# Patient Record
Sex: Female | Born: 1940 | Race: White | Hispanic: No | Marital: Single | Smoking: Former smoker
Health system: Southern US, Community
[De-identification: ages and names within clinical notes are randomized; demographics above are authoritative.]

## PROBLEM LIST (undated history)

## (undated) DIAGNOSIS — C801 Malignant (primary) neoplasm, unspecified: Secondary | ICD-10-CM

## (undated) DIAGNOSIS — K259 Gastric ulcer, unspecified as acute or chronic, without hemorrhage or perforation: Secondary | ICD-10-CM

## (undated) DIAGNOSIS — F419 Anxiety disorder, unspecified: Secondary | ICD-10-CM

## (undated) DIAGNOSIS — E785 Hyperlipidemia, unspecified: Secondary | ICD-10-CM

## (undated) DIAGNOSIS — M810 Age-related osteoporosis without current pathological fracture: Secondary | ICD-10-CM

## (undated) DIAGNOSIS — Z8619 Personal history of other infectious and parasitic diseases: Secondary | ICD-10-CM

## (undated) DIAGNOSIS — G971 Other reaction to spinal and lumbar puncture: Secondary | ICD-10-CM

## (undated) DIAGNOSIS — M1990 Unspecified osteoarthritis, unspecified site: Secondary | ICD-10-CM

## (undated) DIAGNOSIS — K759 Inflammatory liver disease, unspecified: Secondary | ICD-10-CM

## (undated) DIAGNOSIS — M069 Rheumatoid arthritis, unspecified: Secondary | ICD-10-CM

## (undated) DIAGNOSIS — F32A Depression, unspecified: Secondary | ICD-10-CM

## (undated) DIAGNOSIS — I219 Acute myocardial infarction, unspecified: Secondary | ICD-10-CM

## (undated) DIAGNOSIS — J189 Pneumonia, unspecified organism: Secondary | ICD-10-CM

## (undated) DIAGNOSIS — K219 Gastro-esophageal reflux disease without esophagitis: Secondary | ICD-10-CM

## (undated) DIAGNOSIS — Z9289 Personal history of other medical treatment: Secondary | ICD-10-CM

## (undated) HISTORY — PX: COLONOSCOPY: SHX174

## (undated) HISTORY — PX: EPIDURAL BLOCK INJECTION: SHX1516

## (undated) HISTORY — PX: BACK SURGERY: SHX140

## (undated) HISTORY — PX: TMJ ARTHROPLASTY: SHX1066

## (undated) HISTORY — PX: ABDOMINAL HYSTERECTOMY: SHX81

## (undated) HISTORY — PX: ESOPHAGOGASTRODUODENOSCOPY: SHX1529

## (undated) HISTORY — PX: FRACTURE SURGERY: SHX138

## (undated) HISTORY — PX: OTHER SURGICAL HISTORY: SHX169

## (undated) HISTORY — PX: TONSILLECTOMY: SUR1361

---
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
## 1975-03-31 DIAGNOSIS — Z9289 Personal history of other medical treatment: Secondary | ICD-10-CM

## 1975-03-31 HISTORY — DX: Personal history of other medical treatment: Z92.89

## 1993-03-30 HISTORY — PX: BREAST SURGERY: SHX581

## 1998-03-30 HISTORY — PX: CARDIAC CATHETERIZATION: SHX172

## 2002-05-10 ENCOUNTER — Encounter
Admission: RE | Admit: 2002-05-10 | Discharge: 2002-05-10 | Payer: Self-pay | Admitting: Physical Medicine and Rehabilitation

## 2002-05-10 ENCOUNTER — Encounter: Payer: Self-pay | Admitting: Physical Medicine and Rehabilitation

## 2003-05-14 ENCOUNTER — Encounter: Admission: RE | Admit: 2003-05-14 | Discharge: 2003-05-14 | Payer: Self-pay | Admitting: Specialist

## 2003-07-06 ENCOUNTER — Encounter
Admission: RE | Admit: 2003-07-06 | Discharge: 2003-07-06 | Payer: Self-pay | Admitting: Physical Medicine and Rehabilitation

## 2005-05-29 ENCOUNTER — Encounter: Admission: RE | Admit: 2005-05-29 | Discharge: 2005-05-29 | Payer: Self-pay | Admitting: Specialist

## 2005-07-21 ENCOUNTER — Inpatient Hospital Stay (HOSPITAL_COMMUNITY): Admission: RE | Admit: 2005-07-21 | Discharge: 2005-07-24 | Payer: Self-pay | Admitting: Specialist

## 2007-12-26 ENCOUNTER — Ambulatory Visit (HOSPITAL_COMMUNITY): Admission: RE | Admit: 2007-12-26 | Discharge: 2007-12-26 | Payer: Self-pay | Admitting: Specialist

## 2010-08-12 NOTE — Op Note (Signed)
NAMEWHITNIE, Traci Garrison                ACCOUNT NO.:  [id number]   MEDICAL RECORD NO.:  [id number]          PATIENT TYPE:  AMB   LOCATION:  DAY                          FACILITY:  [facility]   PHYSICIAN:  Jene Every, M.D.    DATE OF BIRTH:  [birth year]   DATE OF PROCEDURE:  12/26/2007  DATE OF DISCHARGE:                               OPERATIVE REPORT   PREOPERATIVE DIAGNOSIS:  Impingement syndrome, possible rotator cuff  tear, right shoulder.   POSTOPERATIVE DIAGNOSIS:  Impingement syndrome, possible rotator cuff  tear, right shoulder.   PROCEDURE PERFORMED:  1. Examination under anesthesia.  2. Right shoulder arthroscopy.  3. Subacromial decompression, bursectomy.   ANESTHESIA:  General.   ASSISTANT:  Roma Schanz, P.A.   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  MRI indicating tendinitis, possible full-thickness tear.  She has been indicated for arthroscopic evaluation and debridement and  open repair if a tear was noted.  Risks and benefits discussed including  bleeding, infection, no change in symptoms, worsening symptoms, need for  open rotator cuff repair, etc.   TECHNIQUE:  The patient in supine beach-chair position after adequate  induction of general anesthesia, 1 gram of Kefzol, the right shoulder  and upper extremity was prepped and draped in the usual sterile fashion.  The patient had full range of motion under examination.  Surgical marker  was utilized to delineate the acromion and the [facility] joint.  Standard  posterolateral portal was made with the incision through the skin only.  A blunt cannula was then directed to the glenohumeral space in line with  the coracoid with the arm in the 70/30 traction position, penetrated  atraumatically.  Inspection of the joint revealed normal biceps tendon,  no evidence of labral tear, mild degenerative changes were  noted.  Subscap was normal. Evaluation of the rotator cuff revealed no evidence  of the tear of the rotator cuff.  Next we redirected the camera in the  subacromial space.  Exuberant hypertrophic bursitis and synovitis was  noted.  A lateral portal was fashioned through the skin, a cannula  introduced, a shaver was introduced and utilized to perform a bursectomy  and synovectomy.  We were then able to fully evaluate the rotator cuff  and there was good vascularity, no evidence of a tear with internal-  external rotation.  ArthroWand was introduced and utilized to resect the  CA ligament.  The anterolateral corner of the acromion was shaved with a  shaver following this and the subacromial space was debrided.  Full  inspection revealed no evidence of tear, good range.  All  instrumentation was removed.  Portals were closed with 4-0 nylon simple  sutures.  0.25% Marcaine with epinephrine was infiltrated in  the joint.  Wound was dressed sterilely.  Patient was awoken without  difficulty and transported to the recovery room in satisfactory  condition.   The patient tolerated the procedure well, no complications.      Jene Every, M.D.  Electronically Signed  JB/MEDQ  D:  12/26/2007  T:  12/27/2007  Job:  [id number]

## 2010-08-15 NOTE — Op Note (Signed)
Traci Garrison, Traci Garrison                ACCOUNT NO.:  [id number]   MEDICAL RECORD NO.:  [id number]           PATIENT TYPE:   LOCATION:                                 FACILITY:   PHYSICIAN:  Jene Every, M.D.         DATE OF BIRTH:   DATE OF PROCEDURE:  07/21/2005  DATE OF DISCHARGE:                                 OPERATIVE REPORT   PREOPERATIVE DIAGNOSES:  1.  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  3.  Status post lumbar fusion L4 to the sacrum.   PROCEDURES PERFORMED:  1.  Exploration of fusion L4-5, L5-S1.  2.  Removal of hardware from previous fusion, the left and the right lumbar      rod.  3.  Redo decompression at 4-5, decompression at 3-4.  4.  Pedicle screw instrumentation L3-4.  5.  TLIF 3-4 utilizing autologous and autograft bone.  6.  Lateral mass fusion utilizing autologous and autograft bone.  7.  Intraoperative free-running EMGs, neural monitoring for five hours.   ANESTHESIA:  General anesthesia.   ASSISTANT:  Kerrin Champagne, M.D.   BRIEF HISTORY AND INDICATIONS:  A 70 year old status post fusion of L4 to  the sacrum that had fused solidly, though the patient had persistent back  and left lower extremity radicular pain.  Further imaging indicated stenosis  at 3-4.  She had claudicating symptoms particularly on the left.  She had a  bone scan indicating no significant activity suggests a nonunion.  Her CT  scan demonstrated that as well.  She had discography indicating discordant  pain at 2-3 and 3-4.  Again, primarily her problem was at 3-4, the stenosis  and retrolisthesis with slight motion noted on radiographs.  Fusion was also  extended to not quite the neutral vertebra.  She was indicated for extension  of the fusion at 3-4, exploration of her fusion and decompression.  Risks  and benefits discussed including bleeding, infection, __________, CSF  leakage, epidural fibrosis, __________ disease, need for adjacent fusion in  the future,  etc., anesthetic complications, DVT and PE.   DESCRIPTION OF PROCEDURE:  Patient in supine position, after the induction  of general anesthesia and 1 g Kefzol, she was placed prone on the Wilson  frame, [place] table.  All bony prominences well padded.  Lumbar region  prepped and draped in the usual sterile fashion.  Incision was made from the  spinous process to the 3 to 5.  Subcutaneous tissue was dissected.  Electrocautery was utilized to  achieve hemostasis.  Dorsal lumbar fascia  identified and divided in line with skin incision.  Paraspinous muscle  elevated from lamina of 3 and to expose the pedicle screws and rods at 4-5  and S1.  Viper retractor was placed.  Electrocautery was utilized to achieve  hemostasis.  We utilized the Cell Saver as well.  After exposing the rods,  we explored the fusion at 4-5 and at 5-1.  There appeared to be a solid  fusion at both of those levels.  We then loosened the  connector rods.  This  was a TSRH 3-D system.  We left the pedicle screws intact as they seemed to  be sufficient.  Following this, we proceeded with exposure of the TPs at 3  and at 4.  This was performed.  Then removed the spinous process of 3,  morcellized and saved it for bone graft.  I then performed a laminotomy  centrally at 3-4 utilizing a 2 and 3 mm Kerrison.  Significant stenosis was  noted here at 3-4 and at 4-5 interval just along the lateral recess this  infolded hypertrophic ligamentum flavum and epidural fibrosis.  This was  decompressed and removed.  There was significant compression of the 4 root  in the lateral recess.  This was decompressed.  Carried this down below the  pedicle of 4.  Foraminotomy of 4 was performed removing epidural fibrosis  and the contiguous lamina.  Following this, we adequately decompressed the  sac of the 4 root and 3 root as well.  Electrocautery was utilized to  achieve hemostasis.  We had removed the inferior process of 3 and the  superior  articulating process of 4, exposing the foramen for the exposure to  the TLIF.  Bipolar electrocautery was utilized to achieve hemostasis of an  epidural venous plexus.  With the neural elements well protected, entered  the disc space with an Epstein.  This was enlarged with an osteotome  removing a posterior lip.  Next, this was dilated with an 8 mm dilator, felt  10 was too large.  We then used a laminar spreader between the pedicle screw  and the lamina, dilated that and then we cleaned the disc space with  curettes, high curettes, ring curettes, straight and up-biting pituitaries.  Following this, it was copiously irrigated.  This was best trialed to a 22  mm x 8 mm Medtronic PEEK cage.  This was packed with autologous cancellous  bone from the spinous process.  Prior to this, we packed the disc space with  cancellous bone from the decompression and then we inserted the TLIF,  removed the distraction with excellent placement and excellent purchase.  This was in the AP and lateral plane, found to be satisfactory.  We examined  it posteriorly. Placed a hockey stick probe in the foramen of 3 and 4, found  to be widely patent.   It was also widely patent at 3 and 4 on the left and down the lateral  recessed to 5.   Next, at the intersection of the transverse process, the outer border of the  facet and the pars midway on the TP we provided a starting hole with an awl,  first on the left and then on the right.  We observed this in the AP and  lateral plane under x-ray.  This was then advanced with a pedicle finder,  the small pedicle finder, without difficulty into the vertebral body, again  confirmed with x-ray.  It measured 40 mm in length, measured with a ball-tip  probe and was in cancellous bone at all times.  This was tapped with a 4.5  tap and then 5.5 3-D screws were then inserted.  Prior to this, we used a high speech bur to decorticate on the TP of 3 and the previous fusion mass   down at 4.  We also used autologous bone and we used bone graft.  We used a  Grafton bone graft and cancellous chips into the lateral recess.  In a  similar fashion, the pedicle screw was inserted on the right.  We used a  hockey stick probe and felt the inner surface of the pedicle and this was  found to be satisfactory without a broach in the cortex noted.  There were  no changes on the running EMGs as well.  We tested pedicle screws, left was  30 and the right was 17.  Next, we took a 90 mm rod that was precontoured  and further contoured it in the fashion of the placement of the four screws.  We placed off sets on the screws over the screw heads.  We lowered these  down on the four pedicle screws.  These were then tightened provisionally on  the left and on the right.  We compressed on the left and then tightened the  screws and then on the frt as well.  We then broke off the attachment after  appropriate torquing.  Then I checked the foramen at 3 and 4 and found to be  widely patent with a hockey stick probe.  In the AP and lateral plane this  was found to be satisfactory.  We decided not to use a crosslink because we  were just extending one level.  Wound copiously irrigated with antibiotic  irrigation.  Inspection revealed no evidence of CSF leakage or active  bleeding.  Inspected the canal, no evidence of loose bone.  Thrombin soaked  Gelfoam was placed over laminotomy defect. After the rod was placed, we put  some more of the cancellous chips Grafton into the lateral mass.  Next, we  placed a Hemovac and brought it out through a lateral stab wound in the  skin.  Copious irrigation with antibiotic irrigation.  Repair of the deep  muscle and fascia with #1 Vicryl interrupted figure-of-eight sutures.  Subcutaneous tissue reapproximated with 2-0 Vicryl sutures.  Skin  reapproximated with staples.  Wounds dressed sterilely.  Placed supine on  hospital bed, extubated without difficulty  and transported to the recovery  room in satisfactory condition.   Patient tolerated the procedure well with no complications.   BLOOD LOSS:  700 mL.  We returned 300 via Cell Saver.      Jene Every, M.D.  Electronically Signed     JB/MEDQ  D:  07/21/2005  T:  07/22/2005  Job:  [id number]

## 2010-08-15 NOTE — Discharge Summary (Signed)
Traci Garrison, Garrison                ACCOUNT NO.:  [id number]   MEDICAL RECORD NO.:  [id number]           PATIENT TYPE:   LOCATION:                                 FACILITY:   PHYSICIAN:  Jene Every, M.D.    DATE OF BIRTH:  [birth year]   DATE OF ADMISSION:  DATE OF DISCHARGE:                               DISCHARGE SUMMARY   ADMISSION DIAGNOSES:  1. Moderate spinal stenosis.  2. Degenerative disk disease, L3-4.  3. Osteoarthritis.  4. Depression.  5. Anxiety.  6. Remote history of hepatitis.   DISCHARGE DIAGNOSES:  1. Moderate spinal stenosis.  2. Degenerative disk disease, L3-4.  3. Osteoarthritis.  4. Depression.  5. Anxiety.  6. Remote history of hepatitis.   PROCEDURE:  The patient was taken to the OR on July 21, 2005 to undergo  a lumbar decompression at L3-4 with redo decompression L4-5, removal of  previous fusion hardware, extension of fusion up to L3-4.  Surgeon Dr.  Jene Every, assistant Vira Browns, anesthesia general, complications  none.   CONSULTATIONS:  PT/OT, case management.   HISTORY:  Traci Garrison is a pleasant, 70 year old female with a history of  fusion at L4-5 to S1 that had fused solidly.  However, the patient  eventually developed persistent back and left lower extremity pain.  Imaging studies showed degenerative changes at L3-4, diskogram also  indicated discordant pain at L2-3 and L3-4.  Patient underwent  conservative treatment without significant relief of her symptoms.  It  was felt, at this point, she would require extension of her fusion.  The  risks and benefits were discussed with the patient, she did wish to  proceed.   LABORATORY DATA:  Preoperative labs showed a white cell count of 6.5,  hemoglobin 14.1, hematocrit 40.3, normal coagulation studies, normal  urinalysis.  Repeat labs throughout the hospital course included CBCs on  a daily basis, white cell count remained normal, hemoglobin did drop to  a level of 9.4, but remained  stable at the time of discharge at a level  of 9.7, hematocrit 28.3.  Preoperative chemistry showed sodium 140,  potassium 3.9, with normal BUN and creatinine functions.  These were  followed throughout the hospital course, all remained within normal  range.  Urinalysis, done postoperatively, was negative.  Chest x-ray,  taken preoperatively, showed no acute cardiac processes.   HOSPITAL COURSE:  The patient was admitted, taken to the OR, and  underwent the above-stated procedure without difficulty.  She was then  transferred from the [facility] unit to the orthopedic floor for continued  postoperative care.  Postoperatively, she was placed on PCA analgesics.  Pain control was the major issues.  One HemoVac drain was placed, this  was removed postoperative day #1.  The patient did fairly well.  Her  pain control remained a big issue.  She was encouraged to mobilized  early.  PT/OT was consulted.  She did have a slight fever and incentive  spirometer was encouraged.  Patient did progress fairly well with  physical therapy.  She was voiding without difficulty.  Pain was managed  on p.o. analgesics.  It was felt that the patient was ready to be  discharged to home with home-health therapy needs met.   DISPOSITION:  The patient discharged home with home-health PT/OT.   DISCHARGE MEDICATIONS:  1. All home medication as well as her Duragesic patch.  2. Percocet 10/650 1 to 2 p.o. q.4 to 6 p.r.n. pain.  3. Robaxin 750 mg 1 p.o. t.i.d. p.r.n. spasm.   DIET:  As tolerated.   ACTIVITY:  She is to walk as tolerated utilizing brace and back  precautions.   WOUND CARE:  Change her dressing daily, it is okay for her to shower.   CONDITION ON DISCHARGE:  Stable.   FINAL DIAGNOSIS:  Doing well status post lumbar decompression with  lateral fusion utilizing pedicle screws and instrumentation.      Roma Schanz, P.A.      Jene Every, M.D.  Electronically Signed    CS/MEDQ  D:   01/12/2006  T:  01/12/2006  Job:  [id number]

## 2010-08-15 NOTE — H&P (Signed)
NAMEBRANDEN, Garrison                ACCOUNT NO.:  [id number]   MEDICAL RECORD NO.:  [id number]          PATIENT TYPE:  INP   LOCATION:  NA                           FACILITY:  [facility]   PHYSICIAN:  Jene Every, M.D.    DATE OF BIRTH:  25-May-1940   DATE OF ADMISSION:  DATE OF DISCHARGE:                                HISTORY & PHYSICAL   CHIEF COMPLAINT:  Back and bilateral lower extremity pain.   HISTORY:  [name] is a 70 year old female who has a longstanding history  of low back pain.  She initially was being seen in our practice by Dr. Ethelene Hal  for postoperative pain syndrome.  She has had a previous two-level lumbar  fusion done by Dr. Nadene Rubins approximately four years ago as she had  complications with a migrating screw that had to be removed.  At that time  she was having predominantly back pain.  Over the past six months, she  started developing bilateral lower extremity pain.  She had a discography  which was inconclusive.  She was evaluated by Dr. 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  Patient was then referred back to Dr. Ethelene Hal for consideration of  epidural steroid injections.  She underwent these without significant relief  of her symptoms. It was felt at this point, the patient would need an  extension of her fusion.  The risks and benefits of this were discussed with  the patient by Dr. Shelle Iron.  Case was also discussed with Dr. Otelia Sergeant as well.  Patient agreed with the risks and elected to proceed with the surgery.   CURRENT MEDICATIONS:  1.  Mobic one p.o. daily.  2.  Duragesic patch 100 mcg every two days.  3.  Percocet 10/650 one to two p.o. q.4-6h. p.r.n. pain.  4.  Robaxin 750 mg one p.o. t.i.d. p.r.n. spasm.  5.  Neurontin 600 mg one p.o. q.i.d.  6.  Premarin daily.  7.  Xanax 0.5 mg one and a half p.o. daily p.r.n.   ALLERGIES:  NO KNOWN DRUG ALLERGIES.   PAST MEDICAL HISTORY:  1.  Osteoarthritis.  2.  Depression.  3.  Anxiety.  4.  Remote history of hepatitis.   PAST SURGICAL HISTORY:  1.  Two-level spinal fusion L4 to S1.  2.  Oophorectomy.  3.  Bladder tack.  4.  Fractured left elbow.   SOCIAL HISTORY:  Patient is married.  She is disabled.  She is a previous  smoker for two to three years in college but had quit.  Negative for alcohol  consumption.  Husband will be caregiver following surgery.   FAMILY HISTORY:  Sister significant for MI, coronary artery disease age 38.  Mother and sister both have history of osteoarthritis.   REVIEW OF SYSTEMS:  GENERAL:  Patient denies any fevers, chills, night  sweats or bleeding tendencies.  CNS:  No blurred or double vision, seizure,  headache or paralysis.  RESPIRATORY:  No so, productive cough or hemoptysis.  CARDIOVASCULAR:  No chest pain, angina or orthopnea.  GU:  No dysuria,  hematuria or discharge.  GI: No nausea, vomiting or diarrhea, constipation  or bloody stools.  MUSCULOSKELETAL:  As pertinent in HPI.   PHYSICAL EXAMINATION:  GENERAL APPEARANCE:  This is a well-developed, well-  nourished female, sitting upright in mild distress.  VITAL SIGNS:  Pulse 80, respiratory 16, blood pressure 124/68.  HEENT:  Atraumatic and normocephalic.  Pupils are equal, round and reactive  to light.  Extraocular movements intact.  NECK:  Supple.  No lymphadenopathy.  CHEST:  Clear to auscultation bilaterally.  No wheezing, rhonchi or rales.  BREASTS/GU:  Not examined, not pertinent.  CARDIOVASCULAR:  Regular rate and rhythm without murmurs, rubs, or gallops.  ABDOMEN:  Soft and nontender, nondistended, bowel sounds x4.  SKIN:  No rashes or lesions are noted.  Patient does have positive straight  leg raising bilaterally, left greater than right, decreased sensation on the  left lower extremity.   IMPRESSION:  Adjacent segment disease, moderate spinal  stenosis,  degenerative changes L3-4.   PLAN:  Patient will be admitted to [facility] to  undergo decompression L3-4, TLIF with pedicle screw instrumentation, review  of hardware and exploration of previous fusion.      Roma Schanz, P.A.      Jene Every, M.D.  Electronically Signed    CS/MEDQ  D:  07/20/2005  T:  07/20/2005  Job:  [id number]

## 2010-09-28 DEATH — deceased

## 2013-11-02 ENCOUNTER — Other Ambulatory Visit: Payer: Self-pay | Admitting: Orthopedic Surgery

## 2013-11-02 DIAGNOSIS — M5137 Other intervertebral disc degeneration, lumbosacral region: Secondary | ICD-10-CM

## 2013-11-08 ENCOUNTER — Ambulatory Visit
Admission: RE | Admit: 2013-11-08 | Discharge: 2013-11-08 | Disposition: A | Discharge status: Home / Self Care | Payer: PRIVATE HEALTH INSURANCE | Source: Ambulatory Visit | Attending: Orthopedic Surgery | Admitting: Orthopedic Surgery

## 2013-11-08 DIAGNOSIS — M5137 Other intervertebral disc degeneration, lumbosacral region: Secondary | ICD-10-CM

## 2013-12-26 ENCOUNTER — Encounter (HOSPITAL_COMMUNITY): Payer: Self-pay | Admitting: Pharmacy Technician

## 2013-12-28 ENCOUNTER — Other Ambulatory Visit (HOSPITAL_COMMUNITY): Payer: Self-pay | Admitting: *Deleted

## 2013-12-28 NOTE — Pre-Procedure Instructions (Signed)
Cailan Antonucci  12/28/2013   Your procedure is scheduled on:  Thursday, January 04, 2014 at 7:30 AM.   Report to [facility] Entrance "A" Admitting Office at 5:30 AM.   Call this number if you have problems the morning of surgery: [phone]   Remember:   Do not eat food or drink liquids after midnight Wednesday,  01/03/14.   Take these medicines the morning of surgery with A SIP OF WATER: gabapentin (NEURONTIN), predniSONE (DELTASONE), tiZANidine (ZANAFLEX), oxyCODONE-acetaminophen (PERCOCET) - if needed, ALPRAZolam Duanne Moron) - if needed.  Bring brace with you day of surgery.    Do not wear jewelry, make-up or nail polish.  Do not wear lotions, powders, or perfumes. You may wear deodorant.  Do not shave 48 hours prior to surgery.   Do not bring valuables to the hospital.  [facility] is not responsible                  for any belongings or valuables.               Contacts, dentures or bridgework may not be worn into surgery.  Leave suitcase in the car. After surgery it may be brought to your room.  For patients admitted to the hospital, discharge time is determined by your                treatment team.               Special Instructions: [place] - Preparing for Surgery  Before surgery, you can play an important role.  Because skin is not sterile, your skin needs to be as free of germs as possible.  You can reduce the number of germs on you skin by washing with CHG (chlorahexidine gluconate) soap before surgery.  CHG is an antiseptic cleaner which kills germs and bonds with the skin to continue killing germs even after washing.  Please DO NOT use if you have an allergy to CHG or antibacterial soaps.  If your skin becomes reddened/irritated stop using the CHG and inform your nurse when you arrive at Short Stay.  Do not shave (including legs and underarms) for at least 48 hours prior to the first CHG shower.  You may shave your face.  Please follow these instructions  carefully:   1.  Shower with CHG Soap the night before surgery and the                                morning of Surgery.  2.  If you choose to wash your hair, wash your hair first as usual with your       normal shampoo.  3.  After you shampoo, rinse your hair and body thoroughly to remove the                      Shampoo.  4.  Use CHG as you would any other liquid soap.  You can apply chg directly       to the skin and wash gently with scrungie or a clean washcloth.  5.  Apply the CHG Soap to your body ONLY FROM THE NECK DOWN.        Do not use on open wounds or open sores.  Avoid contact with your eyes, ears, mouth and genitals (private parts).  Wash genitals (private parts) with your normal soap.  6.  Wash thoroughly,  paying special attention to the area where your surgery        will be performed.  7.  Thoroughly rinse your body with warm water from the neck down.  8.  DO NOT shower/wash with your normal soap after using and rinsing off       the CHG Soap.  9.  Pat yourself dry with a clean towel.            10.  Wear clean pajamas.            11.  Place clean sheets on your bed the night of your first shower and do not        sleep with pets.  Day of Surgery  Do not apply any lotions the morning of surgery.  Please wear clean clothes to the hospital/surgery center.     Please read over the following fact sheets that you were given: Pain Booklet, Coughing and Deep Breathing, Blood Transfusion Information, MRSA Information and Surgical Site Infection Prevention

## 2013-12-29 ENCOUNTER — Encounter (HOSPITAL_COMMUNITY): Payer: Self-pay

## 2013-12-29 ENCOUNTER — Ambulatory Visit (HOSPITAL_COMMUNITY)
Admission: RE | Admit: 2013-12-29 | Discharge: 2013-12-29 | Disposition: A | Discharge status: Home / Self Care | Payer: Medicare Other | Source: Ambulatory Visit | Attending: Surgery | Admitting: Surgery

## 2013-12-29 ENCOUNTER — Encounter (HOSPITAL_COMMUNITY)
Admission: RE | Admit: 2013-12-29 | Discharge: 2013-12-29 | Disposition: A | Discharge status: Home / Self Care | Payer: Medicare Other | Source: Ambulatory Visit | Attending: Orthopedic Surgery | Admitting: Orthopedic Surgery

## 2013-12-29 DIAGNOSIS — M5135 Other intervertebral disc degeneration, thoracolumbar region: Secondary | ICD-10-CM | POA: Insufficient documentation

## 2013-12-29 DIAGNOSIS — Z01818 Encounter for other preprocedural examination: Secondary | ICD-10-CM | POA: Insufficient documentation

## 2013-12-29 HISTORY — DX: Pneumonia, unspecified organism: J18.9

## 2013-12-29 HISTORY — DX: Unspecified osteoarthritis, unspecified site: M19.90

## 2013-12-29 HISTORY — DX: Inflammatory liver disease, unspecified: K75.9

## 2013-12-29 HISTORY — DX: Anxiety disorder, unspecified: F41.9

## 2013-12-29 LAB — CBC
HCT: 41 % (ref 36.0–46.0)
HEMOGLOBIN: 13.9 g/dL (ref 12.0–15.0)
MCH: 32.7 pg (ref 26.0–34.0)
MCHC: 33.9 g/dL (ref 30.0–36.0)
MCV: 96.5 fL (ref 78.0–100.0)
PLATELETS: 201 10*3/uL (ref 150–400)
RBC: 4.25 MIL/uL (ref 3.87–5.11)
RDW: 13 % (ref 11.5–15.5)
WBC: 3.9 10*3/uL — ABNORMAL LOW (ref 4.0–10.5)

## 2013-12-29 LAB — URINALYSIS, ROUTINE W REFLEX MICROSCOPIC
Bilirubin Urine: NEGATIVE
Glucose, UA: NEGATIVE mg/dL
Hgb urine dipstick: NEGATIVE
KETONES UR: NEGATIVE mg/dL
Leukocytes, UA: NEGATIVE
NITRITE: NEGATIVE
PH: 6 (ref 5.0–8.0)
Protein, ur: NEGATIVE mg/dL
Specific Gravity, Urine: 1.02 (ref 1.005–1.030)
Urobilinogen, UA: 0.2 mg/dL (ref 0.0–1.0)

## 2013-12-29 LAB — COMPREHENSIVE METABOLIC PANEL
ALK PHOS: 39 U/L (ref 39–117)
ALT: 20 U/L (ref 0–35)
AST: 24 U/L (ref 0–37)
Albumin: 4.5 g/dL (ref 3.5–5.2)
Anion gap: 14 (ref 5–15)
BUN: 18 mg/dL (ref 6–23)
CALCIUM: 8.7 mg/dL (ref 8.4–10.5)
CO2: 24 mEq/L (ref 19–32)
Chloride: 102 mEq/L (ref 96–112)
Creatinine, Ser: 0.73 mg/dL (ref 0.50–1.10)
GFR calc Af Amer: >90 mL/min (ref >90)
GFR calc non Af Amer: 85 mL/min — ABNORMAL LOW (ref >90)
GLUCOSE: 82 mg/dL (ref 70–99)
POTASSIUM: 3.7 meq/L (ref 3.7–5.3)
SODIUM: 140 meq/L (ref 137–147)
TOTAL PROTEIN: 7 g/dL (ref 6.0–8.3)
Total Bilirubin: 0.5 mg/dL (ref 0.3–1.2)

## 2013-12-29 LAB — SURGICAL PCR SCREEN
MRSA, PCR: NEGATIVE
Staphylococcus aureus: NEGATIVE

## 2013-12-29 LAB — TYPE AND SCREEN
ABO/RH(D): A POS
Antibody Screen: NEGATIVE

## 2013-12-29 LAB — PROTIME-INR
INR: 1.06 (ref 0.00–1.49)
Prothrombin Time: 13.8 seconds (ref 11.6–15.2)

## 2013-12-29 LAB — APTT: APTT: 30 s (ref 24–37)

## 2014-01-03 MED ORDER — CEFAZOLIN SODIUM-DEXTROSE 2-3 GM-% IV SOLR
2.0000 g | INTRAVENOUS | Status: AC
  Administered 2014-01-04: 2 g via INTRAVENOUS
  Filled 2014-01-03: qty 50

## 2014-01-03 NOTE — Anesthesia Preprocedure Evaluation (Addendum)
Anesthesia Evaluation  Patient identified by MRN, date of birth, ID band Patient awake    Reviewed: Allergy & Precautions, H&P , NPO status , Patient's Chart, lab work & pertinent test results  History of Anesthesia Complications Negative for: history of anesthetic complications  Airway Mallampati: II TM Distance: >3 FB Neck ROM: Full    Dental  (+) Teeth Intact, Dental Advisory Given   Pulmonary former smoker,  breath sounds clear to auscultation        Cardiovascular Rhythm:Regular Rate:Normal     Neuro/Psych Anxiety  Neuromuscular disease    GI/Hepatic   Endo/Other  negative endocrine ROS  Renal/GU negative Renal ROS     Musculoskeletal  (+) Arthritis -, Rheumatoid disorders,    Abdominal (+)  Abdomen: soft.    Peds  Hematology   Anesthesia Other Findings   Reproductive/Obstetrics negative OB ROS                         Anesthesia Physical Anesthesia Plan  ASA: II  Anesthesia Plan: General   Post-op Pain Management:    Induction: Intravenous  Airway Management Planned: Oral ETT  Additional Equipment:   Intra-op Plan:   Post-operative Plan:   Informed Consent: I have reviewed the patients History and Physical, chart, labs and discussed the procedure including the risks, benefits and alternatives for the proposed anesthesia with the patient or authorized representative who has indicated his/her understanding and acceptance.     Plan Discussed with:   Anesthesia Plan Comments: (2nd IV after turning, verify T&S, watch for pressure on face and eyes while prone)        Anesthesia Quick Evaluation

## 2014-01-04 ENCOUNTER — Encounter (HOSPITAL_COMMUNITY)
Admission: RE | Disposition: A | Discharge status: Home / Self Care | Payer: Self-pay | Source: Ambulatory Visit | Attending: Orthopedic Surgery

## 2014-01-04 ENCOUNTER — Inpatient Hospital Stay (HOSPITAL_COMMUNITY): Payer: Medicare Other

## 2014-01-04 ENCOUNTER — Inpatient Hospital Stay (HOSPITAL_COMMUNITY)
Admission: RE | Admit: 2014-01-04 | Discharge: 2014-01-05 | DRG: 552 | Disposition: A | Discharge status: Home / Self Care | Payer: Medicare Other | Source: Ambulatory Visit | Attending: Orthopedic Surgery | Admitting: Orthopedic Surgery

## 2014-01-04 ENCOUNTER — Encounter (HOSPITAL_COMMUNITY): Payer: Self-pay | Admitting: *Deleted

## 2014-01-04 ENCOUNTER — Inpatient Hospital Stay (HOSPITAL_COMMUNITY): Payer: Medicare Other | Admitting: Certified Registered"

## 2014-01-04 ENCOUNTER — Encounter (HOSPITAL_COMMUNITY): Payer: Medicare Other | Admitting: Certified Registered"

## 2014-01-04 DIAGNOSIS — Z87891 Personal history of nicotine dependence: Secondary | ICD-10-CM | POA: Diagnosis not present

## 2014-01-04 DIAGNOSIS — M549 Dorsalgia, unspecified: Secondary | ICD-10-CM | POA: Diagnosis present

## 2014-01-04 DIAGNOSIS — G8929 Other chronic pain: Secondary | ICD-10-CM | POA: Diagnosis present

## 2014-01-04 DIAGNOSIS — T148XXA Other injury of unspecified body region, initial encounter: Secondary | ICD-10-CM

## 2014-01-04 DIAGNOSIS — S20219A Contusion of unspecified front wall of thorax, initial encounter: Secondary | ICD-10-CM | POA: Diagnosis present

## 2014-01-04 DIAGNOSIS — M961 Postlaminectomy syndrome, not elsewhere classified: Secondary | ICD-10-CM | POA: Diagnosis present

## 2014-01-04 DIAGNOSIS — T888XXS Other specified complications of surgical and medical care, not elsewhere classified, sequela: Secondary | ICD-10-CM

## 2014-01-04 DIAGNOSIS — M069 Rheumatoid arthritis, unspecified: Secondary | ICD-10-CM | POA: Diagnosis present

## 2014-01-04 DIAGNOSIS — B1910 Unspecified viral hepatitis B without hepatic coma: Secondary | ICD-10-CM | POA: Diagnosis present

## 2014-01-04 DIAGNOSIS — S279XXA Injury of unspecified intrathoracic organ, initial encounter: Secondary | ICD-10-CM

## 2014-01-04 DIAGNOSIS — Z981 Arthrodesis status: Secondary | ICD-10-CM

## 2014-01-04 DIAGNOSIS — J939 Pneumothorax, unspecified: Secondary | ICD-10-CM | POA: Diagnosis present

## 2014-01-04 DIAGNOSIS — M4806 Spinal stenosis, lumbar region: Secondary | ICD-10-CM | POA: Diagnosis present

## 2014-01-04 DIAGNOSIS — F419 Anxiety disorder, unspecified: Secondary | ICD-10-CM | POA: Diagnosis present

## 2014-01-04 DIAGNOSIS — M419 Scoliosis, unspecified: Secondary | ICD-10-CM | POA: Diagnosis present

## 2014-01-04 DIAGNOSIS — Z79899 Other long term (current) drug therapy: Secondary | ICD-10-CM

## 2014-01-04 DIAGNOSIS — J95811 Postprocedural pneumothorax: Secondary | ICD-10-CM

## 2014-01-04 DIAGNOSIS — M48061 Spinal stenosis, lumbar region without neurogenic claudication: Secondary | ICD-10-CM

## 2014-01-04 SURGERY — CANCELLED PROCEDURE
Anesthesia: General

## 2014-01-04 MED ORDER — PROPOFOL 10 MG/ML IV BOLUS
INTRAVENOUS | Status: AC
  Filled 2014-01-04: qty 20

## 2014-01-04 MED ORDER — PNEUMOCOCCAL VAC POLYVALENT 25 MCG/0.5ML IJ INJ
0.5000 mL | INJECTION | INTRAMUSCULAR | Status: DC
  Filled 2014-01-04: qty 0.5

## 2014-01-04 MED ORDER — IOHEXOL 300 MG/ML  SOLN
80.0000 mL | Freq: Once | INTRAMUSCULAR | Status: AC | PRN
  Administered 2014-01-04: 80 mL via INTRAVENOUS

## 2014-01-04 MED ORDER — MIDAZOLAM HCL 5 MG/5ML IJ SOLN
INTRAMUSCULAR | Status: DC | PRN
  Administered 2014-01-04 (×2): 1 mg via INTRAVENOUS

## 2014-01-04 MED ORDER — POTASSIUM CHLORIDE IN NACL 20-0.45 MEQ/L-% IV SOLN
INTRAVENOUS | Status: DC
  Administered 2014-01-04 – 2014-01-05 (×2): via INTRAVENOUS
  Filled 2014-01-04 (×4): qty 1000

## 2014-01-04 MED ORDER — PROPOFOL 10 MG/ML IV BOLUS
INTRAVENOUS | Status: DC | PRN
  Administered 2014-01-04: 150 mg via INTRAVENOUS

## 2014-01-04 MED ORDER — HYDROCORTISONE NA SUCCINATE PF 100 MG IJ SOLR
INTRAMUSCULAR | Status: DC | PRN
  Administered 2014-01-04: 100 mg via INTRAVENOUS

## 2014-01-04 MED ORDER — ONDANSETRON HCL 4 MG/2ML IJ SOLN: 4.0000 mg | Freq: Four times a day (QID) | INTRAMUSCULAR | Status: DC | PRN

## 2014-01-04 MED ORDER — ZOLPIDEM TARTRATE 5 MG PO TABS
5.0000 mg | ORAL_TABLET | Freq: Every evening | ORAL | Status: DC | PRN
  Administered 2014-01-04: 5 mg via ORAL
  Filled 2014-01-04: qty 1

## 2014-01-04 MED ORDER — GABAPENTIN 300 MG PO CAPS: 600.0000 mg | ORAL_CAPSULE | Freq: Three times a day (TID) | ORAL | Status: DC

## 2014-01-04 MED ORDER — DEXAMETHASONE SODIUM PHOSPHATE 4 MG/ML IJ SOLN
INTRAMUSCULAR | Status: AC
  Filled 2014-01-04: qty 1

## 2014-01-04 MED ORDER — TIZANIDINE HCL 4 MG PO TABS
4.0000 mg | ORAL_TABLET | Freq: Three times a day (TID) | ORAL | Status: DC
  Administered 2014-01-04 – 2014-01-05 (×3): 4 mg via ORAL
  Filled 2014-01-04 (×5): qty 1

## 2014-01-04 MED ORDER — FOLIC ACID 800 MCG PO TABS: 400.0000 ug | ORAL_TABLET | Freq: Every day | ORAL | Status: DC

## 2014-01-04 MED ORDER — BUPIVACAINE-EPINEPHRINE (PF) 0.25% -1:200000 IJ SOLN
INTRAMUSCULAR | Status: AC
  Filled 2014-01-04: qty 30

## 2014-01-04 MED ORDER — METOCLOPRAMIDE HCL 5 MG/ML IJ SOLN: 5.0000 mg | Freq: Three times a day (TID) | INTRAMUSCULAR | Status: DC | PRN

## 2014-01-04 MED ORDER — PREDNISONE 5 MG PO TABS
5.0000 mg | ORAL_TABLET | Freq: Every day | ORAL | Status: DC
  Administered 2014-01-05: 5 mg via ORAL
  Filled 2014-01-04 (×2): qty 1

## 2014-01-04 MED ORDER — LIDOCAINE HCL (CARDIAC) 20 MG/ML IV SOLN
INTRAVENOUS | Status: AC
  Filled 2014-01-04: qty 10

## 2014-01-04 MED ORDER — FENTANYL CITRATE 0.05 MG/ML IJ SOLN
INTRAMUSCULAR | Status: AC
  Filled 2014-01-04: qty 5

## 2014-01-04 MED ORDER — SUCCINYLCHOLINE CHLORIDE 20 MG/ML IJ SOLN
INTRAMUSCULAR | Status: DC | PRN
  Administered 2014-01-04: 100 mg via INTRAVENOUS

## 2014-01-04 MED ORDER — ACETAMINOPHEN 10 MG/ML IV SOLN: 1000.0000 mg | INTRAVENOUS | Status: DC

## 2014-01-04 MED ORDER — METHOTREXATE 2.5 MG PO TABS: 15.0000 mg | ORAL_TABLET | ORAL | Status: DC

## 2014-01-04 MED ORDER — GABAPENTIN 600 MG PO TABS: 300.0000 mg | ORAL_TABLET | Freq: Three times a day (TID) | ORAL | Status: DC

## 2014-01-04 MED ORDER — OXYCODONE HCL 5 MG PO TABS
ORAL_TABLET | ORAL | Status: AC
  Administered 2014-01-04: 10 mg via ORAL
  Filled 2014-01-04: qty 2

## 2014-01-04 MED ORDER — GABAPENTIN 600 MG PO TABS
600.0000 mg | ORAL_TABLET | Freq: Three times a day (TID) | ORAL | Status: DC
  Administered 2014-01-04: 600 mg via ORAL
  Filled 2014-01-04: qty 1

## 2014-01-04 MED ORDER — THROMBIN 20000 UNITS EX SOLR
CUTANEOUS | Status: AC
  Filled 2014-01-04: qty 20000

## 2014-01-04 MED ORDER — LACTATED RINGERS IV SOLN
INTRAVENOUS | Status: DC | PRN
  Administered 2014-01-04: 07:00:00 via INTRAVENOUS

## 2014-01-04 MED ORDER — OXYCODONE HCL 5 MG PO TABS
5.0000 mg | ORAL_TABLET | Freq: Four times a day (QID) | ORAL | Status: DC | PRN
  Administered 2014-01-04 – 2014-01-05 (×4): 10 mg via ORAL
  Filled 2014-01-04 (×3): qty 2

## 2014-01-04 MED ORDER — FENTANYL CITRATE 0.05 MG/ML IJ SOLN
INTRAMUSCULAR | Status: DC | PRN
  Administered 2014-01-04: 50 ug via INTRAVENOUS
  Administered 2014-01-04: 100 ug via INTRAVENOUS

## 2014-01-04 MED ORDER — MIDAZOLAM HCL 2 MG/2ML IJ SOLN
INTRAMUSCULAR | Status: AC
Start: 2014-01-04 — End: 2014-01-04
  Filled 2014-01-04: qty 2

## 2014-01-04 MED ORDER — DOCUSATE SODIUM 100 MG PO CAPS
100.0000 mg | ORAL_CAPSULE | Freq: Two times a day (BID) | ORAL | Status: DC
  Administered 2014-01-05: 100 mg via ORAL
  Filled 2014-01-04 (×2): qty 1

## 2014-01-04 MED ORDER — ONDANSETRON HCL 4 MG/2ML IJ SOLN
INTRAMUSCULAR | Status: DC | PRN
  Administered 2014-01-04: 4 mg via INTRAVENOUS

## 2014-01-04 MED ORDER — DEXAMETHASONE SODIUM PHOSPHATE 4 MG/ML IJ SOLN: 4.0000 mg | INTRAMUSCULAR | Status: DC

## 2014-01-04 MED ORDER — LIDOCAINE HCL (CARDIAC) 20 MG/ML IV SOLN
INTRAVENOUS | Status: DC | PRN
  Administered 2014-01-04: 100 mg via INTRAVENOUS

## 2014-01-04 MED ORDER — METOCLOPRAMIDE HCL 5 MG PO TABS: 5.0000 mg | ORAL_TABLET | Freq: Three times a day (TID) | ORAL | Status: DC | PRN

## 2014-01-04 MED ORDER — LACTATED RINGERS IV SOLN
INTRAVENOUS | Status: DC | PRN
  Administered 2014-01-04: 08:00:00 via INTRAVENOUS

## 2014-01-04 MED ORDER — GABAPENTIN 300 MG PO CAPS
300.0000 mg | ORAL_CAPSULE | Freq: Three times a day (TID) | ORAL | Status: DC
  Administered 2014-01-05: 300 mg via ORAL
  Filled 2014-01-04 (×3): qty 1

## 2014-01-04 MED ORDER — ONDANSETRON HCL 4 MG PO TABS: 4.0000 mg | ORAL_TABLET | Freq: Four times a day (QID) | ORAL | Status: DC | PRN

## 2014-01-04 MED ORDER — FOLIC ACID 0.5 MG HALF TAB
0.5000 mg | ORAL_TABLET | Freq: Every day | ORAL | Status: DC
  Administered 2014-01-05: 0.5 mg via ORAL
  Filled 2014-01-04: qty 1

## 2014-01-04 MED ORDER — ALPRAZOLAM 0.25 MG PO TABS
0.2500 mg | ORAL_TABLET | Freq: Two times a day (BID) | ORAL | Status: DC | PRN
  Administered 2014-01-04 – 2014-01-05 (×2): 0.5 mg via ORAL
  Filled 2014-01-04 (×3): qty 2

## 2014-01-04 SURGICAL SUPPLY — 56 items
BLADE SURG 10 STRL SS (BLADE) ×4 IMPLANT
BLADE SURG ROTATE 9660 (MISCELLANEOUS) IMPLANT
CLOSURE WOUND 1/2 X4 (GAUZE/BANDAGES/DRESSINGS)
CORDS BIPOLAR (ELECTRODE) ×3 IMPLANT
COVER SURGICAL LIGHT HANDLE (MISCELLANEOUS) ×4 IMPLANT
DRAPE C-ARM 42X72 X-RAY (DRAPES) ×4 IMPLANT
DRAPE INCISE IOBAN 66X45 STRL (DRAPES) ×4 IMPLANT
DRAPE ORTHO SPLIT 77X108 STRL (DRAPES) ×3
DRAPE POUCH INSTRU U-SHP 10X18 (DRAPES) ×4 IMPLANT
DRAPE SURG ORHT 6 SPLT 77X108 (DRAPES) ×2 IMPLANT
DRAPE U-SHAPE 47X51 STRL (DRAPES) ×8 IMPLANT
DRSG MEPILEX BORDER 4X8 (GAUZE/BANDAGES/DRESSINGS) ×4 IMPLANT
DURAPREP 26ML APPLICATOR (WOUND CARE) ×4 IMPLANT
ELECT BLADE 4.0 EZ CLEAN MEGAD (MISCELLANEOUS) ×3
ELECT PENCIL ROCKER SW 15FT (MISCELLANEOUS) ×4 IMPLANT
ELECT REM PT RETURN 9FT ADLT (ELECTROSURGICAL) ×3
ELECTRODE BLDE 4.0 EZ CLN MEGD (MISCELLANEOUS) ×2 IMPLANT
ELECTRODE REM PT RTRN 9FT ADLT (ELECTROSURGICAL) ×1 IMPLANT
GAUZE SPONGE 4X4 16PLY XRAY LF (GAUZE/BANDAGES/DRESSINGS) ×4 IMPLANT
GLOVE BIOGEL PI IND STRL 8 (GLOVE) ×2 IMPLANT
GLOVE BIOGEL PI IND STRL 8.5 (GLOVE) ×2 IMPLANT
GLOVE BIOGEL PI INDICATOR 8 (GLOVE) ×2
GLOVE BIOGEL PI INDICATOR 8.5 (GLOVE) ×2
GLOVE ECLIPSE 8.5 STRL (GLOVE) ×4 IMPLANT
GLOVE ORTHO TXT STRL SZ7.5 (GLOVE) ×4 IMPLANT
GOWN STRL REUS W/ TWL XL LVL3 (GOWN DISPOSABLE) ×2 IMPLANT
GOWN STRL REUS W/TWL 2XL LVL3 (GOWN DISPOSABLE) ×8 IMPLANT
GOWN STRL REUS W/TWL XL LVL3 (GOWN DISPOSABLE) ×6
KIT BASIN OR (CUSTOM PROCEDURE TRAY) ×4 IMPLANT
KIT ROOM TURNOVER OR (KITS) ×4 IMPLANT
NEEDLE 22X1 1/2 (OR ONLY) (NEEDLE) ×4 IMPLANT
NEEDLE SPNL 18GX3.5 QUINCKE PK (NEEDLE) ×3 IMPLANT
NS IRRIG 1000ML POUR BTL (IV SOLUTION) ×4 IMPLANT
PACK LAMINECTOMY ORTHO (CUSTOM PROCEDURE TRAY) ×4 IMPLANT
PACK UNIVERSAL I (CUSTOM PROCEDURE TRAY) ×3 IMPLANT
PAD ARMBOARD 7.5X6 YLW CONV (MISCELLANEOUS) ×6 IMPLANT
SPONGE LAP 4X18 X RAY DECT (DISPOSABLE) ×4 IMPLANT
SPONGE SURGIFOAM ABS GEL 100 (HEMOSTASIS) ×3 IMPLANT
STAPLER VISISTAT 35W (STAPLE) ×4 IMPLANT
STRIP CLOSURE SKIN 1/2X4 (GAUZE/BANDAGES/DRESSINGS) IMPLANT
SURGIFLO TRUKIT (HEMOSTASIS) IMPLANT
SUT BONE WAX W31G (SUTURE) ×4 IMPLANT
SUT MON AB 3-0 SH 27 (SUTURE) ×6
SUT MON AB 3-0 SH27 (SUTURE) ×4 IMPLANT
SUT VIC AB 1 CT1 27 (SUTURE) ×6
SUT VIC AB 1 CT1 27XBRD ANBCTR (SUTURE) ×4 IMPLANT
SUT VIC AB 1 CTX 36 (SUTURE) ×6
SUT VIC AB 1 CTX36XBRD ANBCTR (SUTURE) ×2 IMPLANT
SUT VIC AB 2-0 CT1 18 (SUTURE) ×8 IMPLANT
SYR BULB IRRIGATION 50ML (SYRINGE) ×4 IMPLANT
SYR CONTROL 10ML LL (SYRINGE) ×4 IMPLANT
TAPE CLOTH 4X10 WHT NS (GAUZE/BANDAGES/DRESSINGS) ×6 IMPLANT
TOWEL OR 17X24 6PK STRL BLUE (TOWEL DISPOSABLE) ×4 IMPLANT
TOWEL OR 17X26 10 PK STRL BLUE (TOWEL DISPOSABLE) ×4 IMPLANT
TRAY FOLEY CATH 16FR SILVER (SET/KITS/TRAYS/PACK) ×3 IMPLANT
WATER STERILE IRR 1000ML POUR (IV SOLUTION) ×4 IMPLANT

## 2014-01-04 NOTE — Progress Notes (Signed)
Utilization review completed.  

## 2014-01-04 NOTE — Progress Notes (Signed)
CT scan of chest done.  Appears to be a venous bleed, not increased from OR CXR, small pneumo seen.  CT surgery consulted to evaluate.  Have discussed with surgeon.

## 2014-01-04 NOTE — H&P (Signed)
History of Present Illness The patient is a 73 year old female who presents with back pain. The patient is here today in referral from Dr Tonita Cong. The patient reports low back symptoms including pain (left leg down to the foot) and low back pain which began year(s) ago without any known injury. and Symptoms include pain, while symptoms do not include numbness, incontinence of stool or incontinence of urine. The patient describes the severity of their symptoms as moderate in severity. The patient feels as if the symptoms are worsening. Symptoms are exacerbated by bending. Current treatment includes opioid analgesics (Percocet from Dr Nelva Bush (pain management)) and muscle relaxants. Prior to being seen today the patient was previously evaluated in this clinic (Dr Tonita Cong and Dr Nelva Bush). Past evaluation has included x-ray of the lumbar spine and MRI of the lumbar spine (@ [facility]). Past treatment has included epidural injections and back surgery. Note for "Back pain": The patient is here to discuss other options.   Subjective Transcription  She is a very pleasant, young woman, who has been having progressive, debilitating pain for several years now. In 2007, she had a 3-4 TLIF. Prior to that she had a 4-5/5-1 decompression and fusion that was complicated by hardware malposition that required re-exploration and repositioning. She presents today because of ongoing, severe, debilitating back, buttock and bilateral leg pain, left side worse than the right. To date she has had various forms of injection therapy, P.T., activity modification, narcotic and non-narcotic medications, and nothing has provided her any significant relief. Her quality of life continues to suffer.  A recent MRI done 08-07-13 shows she does have transitional anatomy, but we are assuming she had the spinal fusion at 3-4, 4-5 and 5-1. At 2-3 there is an adjacent collapse, and at 1-2 there is moderate degenerative adjacent collapse. There is some  stenosis, but it is mostly kyphotic adjacent deformity. 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 She also has an adjacent scoliosis above her fusion of about 15 degrees.   Allergies  No Known Allergies08/16/2012 No Known Drug Allergies10/25/2012  Family History Heart Disease sister First Degree Relatives Diabetes Mellitus grandfather mothers side Heart disease in female family member before age 34 Rheumatoid Arthritis mother, sister, grandmother mothers side and grandfather mothers side  Social History  Number of flights of stairs before winded 1 Marital status married Living situation live with spouse Tobacco use Former smoker. in college, former smoker; smoke(d) 1 pack(s) per day Tobacco / smoke exposure no Pain Contract no Illicit drug use yes Current work status disabled Children 1 Alcohol use Occasional alcohol use. current drinker; drinks hard liquor; only occasionally per week Exercise Exercises weekly; does running / walking Drug/Alcohol Rehab (Previously) no Drug/Alcohol Rehab (Currently) no  Medication History  Neurontin (300MG  Capsule, 1 Oral three times daily, Taken starting 10/05/2013) Active. Duragesic-75 (75MCG/HR Patch 72HR, 1 Transdermal 2 days, Taken starting 10/05/2013) Active. TiZANidine HCl (4MG  Tablet, 1 (one) Oral three times daily, as needed, Taken starting 10/05/2013) Active. Percocet (10-325MG  Tablet, 1 (one) Oral four times daily, as needed, Taken starting 10/05/2013) Active. Actemra (Intravenous) Specific dose unknown - Active. Estradiol (0.5MG  Tablet, Oral) Active. PredniSONE (5MG  Tablet, Oral) Active. ALPRAZolam (0.5MG  Tablet, 1-3 Oral daily) Active. Ambien (10MG  Tablet, 1/2 Oral at bedtime) Active. Vivelle-Dot (0.05MG /24HR Patch TW, 1 Transdermal two times a week) Active. Methotrexate (2.5MG  Tablet, 4 Oral once a week) Active. Vitamins/Minerals (1 Oral  daily) Active. Phentermine HCl (37.5MG  Tablet, Oral as needed)  Active. Medications Reconciled  Past Surgical History  Spinal Surgery Tonsillectomy Tubal Ligation Arthroscopy of Shoulder right Hysterectomy complete (non-cancerous) Spinal Fusion lower back  Other Problems Anxiety Disorder Chronic Pain Hepatitis B Rheumatoid Arthritis Ulcer disease  Objective Transcription  She's a pleasant woman, who appears younger than her stated age. She is alert and oriented times 3. She has back pain with palpation, horrific pain with the neutral position, unable to extend because of the pain. She has some discomfort with forward flexion. EHL, tibialis anterior, gastrocnemius are 5/5. No foot drop. Compartments are soft and nontender. No thigh pain, no hip pain, no knee pain, no ankle pain. She has symmetrical, but diminished, DTRs at the knee and Achilles, negative Babinski test, no clonus. No incontinence of bowel or bladder. Well-healed surgical scar in the midline of the lumbar spine.  Imaging Studies: reviewed standing xrays and CT scan.  Pre-op surgical computer planning performed as well.  Patient with predominate back pain secondary to alignment.  Will move forward with planned fusion and corrective osteotomy to improve sagittal alignment  Assessments Transcription At this point in time, the patient has a very complex deformity, both coronal and sagittal plane mal-alignment.    Plans Transcription  To address this surgically, she would require, at the very least, a 2-3 X-LIF to help address the sagittal imbalance and then most likely extending the fusion up to about T10 to address the coronal imbalance and to help improve her sagittal balance. I would also have to remove the hardware in the lumbar spine, place new pedicle screws and extend her fusion to about T10. We have had a long discussion about this, and she would like to be considered for this surgery. I am  somewhat concerned because of her age, but she is in good medical health. The risks include infection, bleeding, nerve damage, death, stroke, paralysis, blood clots, pneumothorax, need for further surgery, loss of bowel or bladder control, ongoing or worse pain, nonunion, hardware failure.

## 2014-01-04 NOTE — Progress Notes (Signed)
Day of Surgery Procedure(s) (LRB): CANCELLED PROCEDURE (N/A) Subjective Patient examined, chest x-ray and CT scan of chest reviewed, Echocardiogram performed in the PACU is reviewed  I was asked to evaluate this 73 year old Caucasian female for a mediastinal hematoma sustained after attempted central line placement by anesthesiologist this morning in preparation for spine constructed surgery. The central line attempt was made for the left subclavian vein. By report the initial needle stick was uneventful, the guidewire was passed without difficulty and the catheter was inserted but had no blood return. The catheter was removed. A second attempt at cannulation of the left subclavian vein was made however the wire would not advance. The patient then had a portable chest x-ray which showed some superior mediastinal widening.A followup CTA of the chest was performed showing the hematoma a without active extravasation from the great vessels. There is no evidence of vessel wall injury or intimal tear of the aortic or great vessels.   The patient is not on chronic blood thinners.She does have rheumatoid arthritis and is on chronic methotrexate.  When Iexamined the patient she iwasresting comfortably in the recovery room alert responsive and with stable vital signs.  Objective: Vital signs in last 24 hours: Temp:  [94.1 F (34.5 C)-97.1 F (36.2 C)] 97 F (36.1 C) (10/08 1027) Pulse Rate:  [74-86] 86 (10/08 1030) Cardiac Rhythm:  [-]  Resp:  [9-26] 11 (10/08 1030) BP: (148-167)/(62-77) 151/77 mmHg (10/08 1030) SpO2:  [96 %-100 %] 96 % (10/08 1030) Weight:  [171 lb (77.565 kg)] 171 lb (77.565 kg) (10/08 0552)  Hemodynamic parameters for last 24 hours:  Normal sinus rhythm, vital signs stable, oxygen saturation normal  Intake/Output from previous day:   Intake/Output this shift: Total I/O In: 1000 [I.V.:1000] Out: 9 [Urine:22]  Exam 73 year old female pleasant in the PACU bed in no  distress Neck without JVD mass or hematoma Small puncture wound left parasternal skin from attempted line placement-mild bruising but no active bleeding Breath sounds clear and equal Cardiac rhythm regular, soft 2/6 systolic flow murmur, No friction rub Abdomen-soft nontender Extremities-good pulses in both upper extremities, lower extremities warm and well perfused Neurologic-alert and appropriate, no focal motor deficit  Lab Results: No results found for this basename: WBC, HGB, HCT, PLT,  in the last 72 hours BMET: No results found for this basename: NA, K, CL, CO2, GLUCOSE, BUN, CREATININE, CALCIUM,  in the last 72 hours  PT/INR: No results found for this basename: LABPROT, INR,  in the last 72 hours ABG No results found for this basename: phart, pco2, po2, hco3, tco2, acidbasedef, o2sat   CBG (last 3)  No results found for this basename: GLUCAP,  in the last 72 hours  Assessment/Plan: S/P Procedure(s) (LRB): CANCELLED PROCEDURE (N/A)  Mediastinal hematoma from attempted central line placement through left subclavian vein-no active bleeding or compromise of the airway or other vascular structures. Trace left apical pneumothorax on CT scan. Echocardiogram showing good biventricular function without pericardial effusion.  The mediastinal hematoma should resolve without surgical intervention-avoid all anticoagulation including aspirin. The planned spine reconstructive operation should be rescheduled for later to allow 1-2 weeks for the mediastinal hematoma to resolve  The patient will be admitted for observation and a followup chest x-ray and CBC panned for this afternoon. Repeat studies in the a.m. will be reviewed and if stable the patient can be discharged.  LOS: 0 days    VAN TRIGT III,PETER 01/04/2014

## 2014-01-04 NOTE — Progress Notes (Signed)
Patient in room N610.  Had lunch, no complaints, no respiratory problems noted.   Appreciate consultants help.   Planning to go home  tomorrow following repeat evaluation.

## 2014-01-04 NOTE — Progress Notes (Signed)
TO CT with crna

## 2014-01-04 NOTE — Anesthesia Postprocedure Evaluation (Signed)
  Anesthesia Post-op Note  Patient: Traci Garrison  Procedure(s) Performed: Procedure(s): CANCELLED PROCEDURE (N/A)  Patient Location: PACU  Anesthesia Type:General  Level of Consciousness: awake  Airway and Oxygen Therapy: Patient Spontanous Breathing and Patient connected to nasal cannula oxygen  Post-op Pain: mild  Post-op Assessment: Post-op Vital signs reviewed and Respiratory Function Stable  Post-op Vital Signs: Reviewed and stable  Last Vitals:  Filed Vitals:   01/04/14 1000  BP: 167/76  Pulse: 79  Temp: 35.8 C  Resp: 9    Complications: cardiovascular complications  Left subclavian line attempt with evidence of venous bleed, CT of Chest done,  Does not appear to be enlarging.  CT surgery consulted.  Surgery cancelled.

## 2014-01-04 NOTE — Progress Notes (Signed)
Have discussed CT findings of small mediastinal hematoma,  no active bleeding seen, and  small pneumo with patient and spouse.  Patient awake and oriented in PACU following CT exam.  CT surgery has been consulted for recommendations regarding further management.

## 2014-01-04 NOTE — Progress Notes (Signed)
  Echocardiogram 2D Echocardiogram has been performed.  Traci Garrison FRANCES 01/04/2014, 11:16 AM

## 2014-01-04 NOTE — Progress Notes (Signed)
Back from CT with CRNA.

## 2014-01-04 NOTE — Progress Notes (Signed)
Patient brought to OR for planned procedure After intubation attempt was made by anesthesia to place a central line (left subclavian) Unsuccessful placement.  Post-procedure portable chest xray was concerning for possible hemothorax.  According to Dr Martinique (radiologist) there is a definite change from pre-op xray that is not due to the fact that this was a portable film.  As a result I elected to cancel the case and get a stat chest CT with contrast.  Will determine next course of action following the CT scan.

## 2014-01-04 NOTE — Transfer of Care (Signed)
Immediate Anesthesia Transfer of Care Note  Patient: Traci Garrison  Procedure(s) Performed: Procedure(s): CANCELLED PROCEDURE (N/A)  Patient Location: PACU  Anesthesia Type:General  Level of Consciousness: awake, alert , oriented and patient cooperative  Airway & Oxygen Therapy: Patient Spontanous Breathing and Patient connected to nasal cannula oxygen  Post-op Assessment: Report given to PACU RN and Post -op Vital signs reviewed and stable  Post vital signs: Reviewed  Complications: No apparent anesthesia complications

## 2014-01-04 NOTE — Anesthesia Procedure Notes (Addendum)
Anesthesia Procedure Note Left subclavian line attempted.  Good blood return, wire threaded easily.  Dilator passed easily and cather passed over wire easily.  No blood asp from cath port.  Cath removed.  Second stick good blood return, wire with resistance.  Procedure aborted, CXR taken.  CXR  without pneumo.  Radiologist concerned about possible bleed as aortic nob not well seen.  Case CX, Patient to XR for CT.  Discussed with Mr. Lemire.  Patient difficult vascular access.  Central line felt necessary for type of surgery planned.  Procedure Name: Intubation Date/Time: 01/04/2014 7:57 AM Performed by: Jenne Campus Pre-anesthesia Checklist: Patient identified, Emergency Drugs available, Suction available, Patient being monitored and Timeout performed Patient Re-evaluated:Patient Re-evaluated prior to inductionOxygen Delivery Method: Circle system utilized Preoxygenation: Pre-oxygenation with 100% oxygen Intubation Type: IV induction Ventilation: Mask ventilation without difficulty and Oral airway inserted - appropriate to patient size Laryngoscope Size: Miller and 3 Grade View: Grade II Tube type: Oral Tube size: 7.0 mm Number of attempts: 1 Airway Equipment and Method: LTA kit utilized and Stylet Placement Confirmation: ETT inserted through vocal cords under direct vision,  positive ETCO2,  CO2 detector and breath sounds checked- equal and bilateral Secured at: 22 cm Tube secured with: Tape   Anesthesia Procedure Note Left subclavian line attempted.  Good blood return, wire threaded easily.  Dilator passed easily and cather passed over wire easily.  No blood asp from cath port.  Cath removed.  Second stick good blood return, wire with resistance.  Procedure aborted, CXR taken.  CXR  without pneumo.  Radiologist concerned about possible bleed as aortic nob not well seen.  Case CX, Patient to XR for CT.  Discussed with Mr. Takemoto.  Patient difficult vascular access.  Central line felt  necessary for type of surgery planned.  Anesthesia Procedure Note Left subclavian line attempted.  Good blood return, wire threaded easily.  Dilator passed easily and cather passed over wire easily.  No blood asp from cath port.  Cath removed.  Second stick good blood return, wire with resistance.  Procedure aborted, CXR taken.  CXR  without pneumo.  Radiologist concerned about possible bleed as aortic nob not well seen.  Case CX, Patient to XR for CT.  Discussed with Mr. Feeback.  Patient difficult vascular access.  Central line felt necessary for type of surgery planned.  Anesthesia Procedure Note Left subclavian line attempted.  Good blood return, wire threaded easily.  Dilator passed easily and cather passed over wire easily.  No blood asp from cath port.  Cath removed.  Second stick good blood return, wire with resistance.  Procedure aborted, CXR taken.  CXR  without pneumo.  Radiologist concerned about possible bleed as aortic nob not well seen.  Case CX, Patient to XR for CT.  Discussed with Mr. Hamza.  Patient difficult vascular access.  Central line felt necessary for type of surgery planned.

## 2014-01-05 ENCOUNTER — Inpatient Hospital Stay (HOSPITAL_COMMUNITY): Payer: Medicare Other

## 2014-01-05 DIAGNOSIS — M48061 Spinal stenosis, lumbar region without neurogenic claudication: Secondary | ICD-10-CM | POA: Diagnosis present

## 2014-01-05 LAB — CBC
HCT: 38.4 % (ref 36.0–46.0)
Hemoglobin: 13.1 g/dL (ref 12.0–15.0)
MCH: 32.7 pg (ref 26.0–34.0)
MCHC: 34.1 g/dL (ref 30.0–36.0)
MCV: 95.8 fL (ref 78.0–100.0)
Platelets: 182 10*3/uL (ref 150–400)
RBC: 4.01 MIL/uL (ref 3.87–5.11)
RDW: 12.9 % (ref 11.5–15.5)
WBC: 6.1 10*3/uL (ref 4.0–10.5)

## 2014-01-05 NOTE — Progress Notes (Signed)
1 Day Post-Op Procedure(s) (LRB): CANCELLED PROCEDURE (N/A) Subjective: Slight tenderness over L para sternal area       CXR clear VSS Hb stable OK to DC home - no driving for 72 hrs Spine surgery ok after Oct 20  Objective: Vital signs in last 24 hours: Temp:  [94.1 F (34.5 C)-98.1 F (36.7 C)] 97.9 F (36.6 C) (10/09 0556) Pulse Rate:  [74-100] 80 (10/09 0556) Cardiac Rhythm:  [-]  Resp:  [9-26] 17 (10/09 0556) BP: (126-167)/(55-88) 152/75 mmHg (10/09 0556) SpO2:  [95 %-100 %] 100 % (10/09 0556) Weight:  [170 lb 10.2 oz (77.4 kg)] 170 lb 10.2 oz (77.4 kg) (10/08 1413)  Hemodynamic parameters for last 24 hours:  stable  Intake/Output from previous day: 10/08 0701 - 10/09 0700 In: 1000 [I.V.:1000] Out: 1150 [Urine:1150] Intake/Output this shift:    Lungs clear Heart normal  Lab Results:  Recent Labs  01/05/14 0445  WBC 6.1  HGB 13.1  HCT 38.4  PLT 182   BMET: No results found for this basename: NA, K, CL, CO2, GLUCOSE, BUN, CREATININE, CALCIUM,  in the last 72 hours  PT/INR: No results found for this basename: LABPROT, INR,  in the last 72 hours ABG No results found for this basename: phart, pco2, po2, hco3, tco2, acidbasedef, o2sat   CBG (last 3)  No results found for this basename: GLUCAP,  in the last 72 hours  Assessment/Plan: S/P Procedure(s) (LRB): CANCELLED PROCEDURE (N/A) DC per primary svc   LOS: 1 day    Traci Garrison,Traci Garrison 01/05/2014

## 2014-01-05 NOTE — Progress Notes (Signed)
Subjective: Patient seen this morning.  Doing well.  Denies cp, sob.  Wants to go home.     Objective: Vital signs in last 24 hours: Temp:  [97 F (36.1 C)-98.1 F (36.7 C)] 97.9 F (36.6 C) (10/09 0556) Pulse Rate:  [79-100] 80 (10/09 0556) Resp:  [10-25] 17 (10/09 0556) BP: (126-152)/(55-88) 152/75 mmHg (10/09 0556) SpO2:  [95 %-100 %] 100 % (10/09 0556) Weight:  [77.4 kg (170 lb 10.2 oz)] 77.4 kg (170 lb 10.2 oz) (10/08 1413)  Intake/Output from previous day: 10/08 0701 - 10/09 0700 In: 1000 [I.V.:1000] Out: 1150 [Urine:1150] Intake/Output this shift:     Recent Labs  01/05/14 0445  HGB 13.1    Recent Labs  01/05/14 0445  WBC 6.1  RBC 4.01  HCT 38.4  PLT 182   No results found for this basename: NA, K, CL, CO2, BUN, CREATININE, GLUCOSE, CALCIUM,  in the last 72 hours No results found for this basename: LABPT, INR,  in the last 72 hours  Exam:  Alert and oriented. Nvi.    Assessment/Plan: D/c home today.  F/u with dr Rolena Infante in a couple of weeks.     Maleko Greulich M 01/05/2014, 10:15 AM

## 2014-01-05 NOTE — Progress Notes (Signed)
Patient ID: Traci Garrison, female   DOB: [birth year], 73 y.o.   MRN: [id number]   Came up to see patient.  Down in radiology for chest xray.

## 2014-01-05 NOTE — Care Management Note (Signed)
  Page 1 of 1   01/05/2014     11:52:14 AM CARE MANAGEMENT NOTE 01/05/2014  Patient:  Traci Garrison, Traci Garrison   Account Number:  [id number]  Date Initiated:  01/05/2014  Documentation initiated by:  Magdalen Spatz  Subjective/Objective Assessment:     Action/Plan:   Anticipated DC Date:     Anticipated DC Plan:           Choice offered to / List presented to:             Status of service:   Medicare Important Message given?  YES (If response is "NO", the following Medicare IM given date fields will be blank) Date Medicare IM given:  01/05/2014 Medicare IM given by:  Magdalen Spatz Date Additional Medicare IM given:   Additional Medicare IM given by:    Discharge Disposition:    Per UR Regulation:    If discussed at Long Length of Stay Meetings, dates discussed:    Comments:

## 2014-01-10 NOTE — Brief Op Note (Signed)
01/04/2014  4:07 PM  PATIENT:  Traci Garrison  73 y.o. female  PRE-OPERATIVE DIAGNOSIS:  DDD   POST-OPERATIVE DIAGNOSIS:  * No post-op diagnosis entered *  PROCEDURE:  Procedure(s): CANCELLED PROCEDURE (N/A)  SURGEON:  Surgeon(s) and Role:    * Melina Schools, MD - Primary  PHYSICIAN ASSISTANT:   ASSISTANTS: Benjiman Core  ANESTHESIA:   general  EBL:     BLOOD ADMINISTERED:none  DRAINS: none   LOCAL MEDICATIONS USED:  NONE  SPECIMEN:  No Specimen  DISPOSITION OF SPECIMEN:  N/A  COUNTS:  YES  TOURNIQUET:  * No tourniquets in log *  DICTATION: .Other Dictation: Dictation Number [id number]  PLAN OF CARE: Admit to inpatient   PATIENT DISPOSITION:  PACU - hemodynamically stable.

## 2014-01-11 NOTE — Op Note (Signed)
NAMEGAYL, IVANOFF                ACCOUNT NO.:  [id number]  MEDICAL RECORD NO.:  [id number]  LOCATION:                                 FACILITY:  PHYSICIAN:  Dahlia Bailiff, MD    DATE OF BIRTH:  04-Mar-1941  DATE OF PROCEDURE:  01/04/2014 DATE OF DISCHARGE:  01/05/2014                              OPERATIVE REPORT   PREOPERATIVE DIAGNOSES:  Postlaminectomy syndrome, failed back syndrome, sagittal imbalance, previous lumbar spinal surgery.  POSTOPERATIVE DIAGNOSES:  Postlaminectomy syndrome, failed back syndrome, sagittal imbalance, previous lumbar spinal surgery.  OPERATIVE PROCEDURE:  Canceled due to anesthetic complication.  HISTORY:  This is a very pleasant 73 year old woman who presented to my office with chronic debilitating back pain and intermittent right leg pain.  Attempts at conservative management over the last several years had failed to alleviate her symptoms.  As a result, she elected to proceed with surgery.  All appropriate risks, benefits, and alternatives were discussed with the patient.  OPERATIVE NOTE:  The patient was brought to the operating room and placed supine on the operating table.  After successful induction of general anesthesia and endotracheal intubation, TEDs, SCDs, and a Foley were inserted as well as all the intraoperative needles for EMG monitoring.  Once this was properly positioned, the anesthesiologist was indicated that he was concerned because he did not have adequate peripheral IV access.  He indicated that he want to place a subclavian line.  During the procedure, he noted that he was unable to place it. As a result, a simple external jugular line was placed.  Because of the failed attempt at the subclavian line, we did order a chest x-ray. Intraoperative chest x-ray was taken and the radiologist contacted me indicating that there was widening in the mediastinum indicative of an acute change from her preoperative chest x-ray.  As a  result, the case was canceled and the patient was extubated and transferred to the PACU. There, the plan was taken to the CT scan for an acute evaluation.  The CT scan was done and demonstrated that there was a bleed from the subclavian vein down to the mediastinum.  Cardiothoracic consultation was requested.  The plan would be to admit the patient and further recommendations per the CT surgery evaluation.     Dahlia Bailiff, MD     DDB/MEDQ  D:  01/10/2014  T:  01/11/2014  Job:  [id number]

## 2014-02-06 NOTE — Discharge Summary (Signed)
Patient ID: Traci Garrison MRN: [id number] DOB/AGE: 06/08/1940 73 y.o.  Admit date: 01/04/2014 Discharge date: 02/06/2014  Admission Diagnoses:  Active Problems:   Chest wall hematoma   Lumbar stenosis   Discharge Diagnoses:  Active Problems:   Chest wall hematoma   Lumbar stenosis  status post Procedure(s): CANCELLED PROCEDURE  Past Medical History  Diagnosis Date  . Pneumonia     2 yrs ago  . Anxiety   . Arthritis   . Hepatitis 1963    Surgeries: Procedure(s): CANCELLED PROCEDURE on 01/04/2014   Consultants: Treatment Team:  Ivin Poot, MD  Discharged Condition: Improved  Hospital Course: Traci Garrison is an 73 y.o. female who was admitted 01/04/2014 for operative treatment of lumbar stenosis (please see the history and physical for the specifics) and had severe unremitting pain that affects sleep, daily activities and work/hobbies. After pre-op clearance, the patient was taken to the operating room on 01/04/2014 and underwent  Procedure(s): CANCELLED PROCEDURE.    Patient was given perioperative antibiotics:  Anti-infectives    Start     Dose/Rate Route Frequency Ordered Stop   01/03/14 1414  ceFAZolin (ANCEF) IVPB 2 g/50 mL premix     2 g100 mL/hr over 30 Minutes Intravenous 30 min pre-op 01/03/14 1414 01/04/14 0758       Patient was given sequential compression devices and early ambulation to prevent DVT.   Patient benefited maximally from hospital stay and there were no complications. At the time of discharge, the patient was urinating/moving their bowels without difficulty, tolerating a regular diet, pain is controlled with oral pain medications and they have been cleared by PT/OT.   Recent vital signs: No data found.    Recent laboratory studies: No results for input(s): WBC, HGB, HCT, PLT, NA, K, CL, CO2, BUN, CREATININE, GLUCOSE, INR, CALCIUM in the last 72 hours.  Invalid input(s): PT, 2   Discharge Medications:     Medication List      STOP taking these medications        BIOTIN PO     gabapentin 300 MG capsule  Commonly known as:  NEURONTIN     multivitamin with minerals Tabs tablet     VITAMIN B 12 PO     VITAMIN D PO      TAKE these medications        ALPRAZolam 0.5 MG tablet  Commonly known as:  XANAX  Take 0.25-0.5 mg by mouth 2 (two) times daily as needed for anxiety.     fentaNYL 75 MCG/HR  Commonly known as:  DURAGESIC - dosed mcg/hr  Place 75 mcg onto the skin every other day.     folic acid 166 MCG tablet  Commonly known as:  FOLVITE  Take 400 mcg by mouth daily.     methotrexate 2.5 MG tablet  Commonly known as:  RHEUMATREX  Take 15 mg by mouth every Sunday. Caution:Chemotherapy. Protect from light.     oxyCODONE-acetaminophen 10-325 MG per tablet  Commonly known as:  PERCOCET  Take 1 tablet by mouth every 6 (six) hours as needed for pain.     predniSONE 5 MG tablet  Commonly known as:  DELTASONE  Take 5 mg by mouth daily with breakfast.     tiZANidine 4 MG tablet  Commonly known as:  ZANAFLEX  Take 4 mg by mouth 3 (three) times daily.        Diagnostic Studies: No results found.      Discharge Instructions  Call MD / Call 911    Complete by:  As directed   If you experience chest pain or shortness of breath, CALL 911 and be transported to the hospital emergency room.  If you develope a fever above 101 F, pus (white drainage) or increased drainage or redness at the wound, or calf pain, call your surgeon's office.     Constipation Prevention    Complete by:  As directed   Drink plenty of fluids.  Prune juice may be helpful.  You may use a stool softener, such as Colace (over the counter) 100 mg twice a day.  Use MiraLax (over the counter) for constipation as needed.     Diet - low sodium heart healthy    Complete by:  As directed      Driving restrictions    Complete by:  As directed   No driving until further notice.     Increase activity slowly as tolerated    Complete  by:  As directed      Lifting restrictions    Complete by:  As directed   No lifting until further notice.           Follow-up Information    Schedule an appointment as soon as possible for a visit with Dahlia Bailiff, MD.   Specialty:  Orthopedic Surgery   Why:  need return office visit 2 weeks postop   Contact information:   [address] [place] [address] [phone]       Discharge Plan:  discharge to   Disposition:     Signed: Lanae Crumbly for Dr. Melina Schools [facility] [phone] 02/06/2014, 1:33 PM

## 2014-08-28 ENCOUNTER — Encounter (HOSPITAL_COMMUNITY)
Admission: RE | Admit: 2014-08-28 | Discharge: 2014-08-28 | Disposition: A | Discharge status: Home / Self Care | Payer: Medicare Other | Source: Ambulatory Visit | Attending: Orthopedic Surgery | Admitting: Orthopedic Surgery

## 2014-08-28 ENCOUNTER — Encounter (HOSPITAL_COMMUNITY): Payer: Self-pay

## 2014-08-28 DIAGNOSIS — Z87891 Personal history of nicotine dependence: Secondary | ICD-10-CM | POA: Diagnosis not present

## 2014-08-28 DIAGNOSIS — G894 Chronic pain syndrome: Secondary | ICD-10-CM | POA: Diagnosis not present

## 2014-08-28 DIAGNOSIS — K219 Gastro-esophageal reflux disease without esophagitis: Secondary | ICD-10-CM | POA: Diagnosis not present

## 2014-08-28 DIAGNOSIS — Z8711 Personal history of peptic ulcer disease: Secondary | ICD-10-CM | POA: Diagnosis not present

## 2014-08-28 HISTORY — DX: Gastric ulcer, unspecified as acute or chronic, without hemorrhage or perforation: K25.9

## 2014-08-28 HISTORY — DX: Personal history of other medical treatment: Z92.89

## 2014-08-28 HISTORY — DX: Age-related osteoporosis without current pathological fracture: M81.0

## 2014-08-28 HISTORY — DX: Hyperlipidemia, unspecified: E78.5

## 2014-08-28 HISTORY — DX: Gastro-esophageal reflux disease without esophagitis: K21.9

## 2014-08-28 HISTORY — DX: Personal history of other infectious and parasitic diseases: Z86.19

## 2014-08-28 HISTORY — DX: Other reaction to spinal and lumbar puncture: G97.1

## 2014-08-28 HISTORY — DX: Rheumatoid arthritis, unspecified: M06.9

## 2014-08-28 LAB — CBC
HCT: 36.6 % (ref 36.0–46.0)
Hemoglobin: 12.6 g/dL (ref 12.0–15.0)
MCH: 33.2 pg (ref 26.0–34.0)
MCHC: 34.4 g/dL (ref 30.0–36.0)
MCV: 96.3 fL (ref 78.0–100.0)
PLATELETS: 167 10*3/uL (ref 150–400)
RBC: 3.8 MIL/uL — ABNORMAL LOW (ref 3.87–5.11)
RDW: 12.3 % (ref 11.5–15.5)
WBC: 3.9 10*3/uL — ABNORMAL LOW (ref 4.0–10.5)

## 2014-08-28 LAB — BASIC METABOLIC PANEL
Anion gap: 8 (ref 5–15)
BUN: 15 mg/dL (ref 6–20)
CALCIUM: 8.8 mg/dL — AB (ref 8.9–10.3)
CO2: 25 mmol/L (ref 22–32)
CREATININE: 0.79 mg/dL (ref 0.44–1.00)
Chloride: 106 mmol/L (ref 101–111)
Glucose, Bld: 84 mg/dL (ref 65–99)
Potassium: 4 mmol/L (ref 3.5–5.1)
Sodium: 139 mmol/L (ref 135–145)

## 2014-08-28 LAB — SURGICAL PCR SCREEN
MRSA, PCR: NEGATIVE
Staphylococcus aureus: NEGATIVE

## 2014-08-28 MED ORDER — CEFAZOLIN SODIUM-DEXTROSE 2-3 GM-% IV SOLR
2.0000 g | INTRAVENOUS | Status: AC
  Administered 2014-08-29: 2 g via INTRAVENOUS

## 2014-08-28 MED ORDER — ACETAMINOPHEN 10 MG/ML IV SOLN
1000.0000 mg | Freq: Four times a day (QID) | INTRAVENOUS | Status: DC
  Administered 2014-08-29 (×2): 1000 mg via INTRAVENOUS

## 2014-08-28 NOTE — Pre-Procedure Instructions (Signed)
Traci Garrison  08/28/2014      [name], [place] [address] [place] [address] Phone: [phone] Fax: [phone]  [facility] [name] [place] [address] Phone: [phone] Fax: [phone]    Your procedure is scheduled on Wed, June 1 @ 8:30 AM  Report to [facility] and report to Admitting @ 6:30 AM  Call this number if you have problems the morning of surgery:  [phone]   Remember:  Do not eat food or drink liquids after midnight.  Take these medicines the morning of surgery with A SIP OF WATER:Alprazolam(Xanax),Gabapentin(Neurontin),Prednisone(Deltasone),and Pain Pill(if needed)              No Goody's,BC's,Aleve,Aspirin,Ibuprofen,FIsh Oil,or any Herbal Medications.    Do not wear jewelry, make-up or nail polish.  Do not wear lotions, powders, or perfumes.  You may wear deodorant.  Do not shave 48 hours prior to surgery.    Do not bring valuables to the hospital.  [facility] is not responsible for any belongings or valuables.  Contacts, dentures or bridgework may not be worn into surgery.  Leave your suitcase in the car.  After surgery it may be brought to your room.  For patients admitted to the hospital, discharge time will be determined by your treatment team.  Patients discharged the day of surgery will not be allowed to drive home.    Special instructions:  [facility] - Preparing for Surgery  Before surgery, you can play an important role.  Because skin is not sterile, your skin needs to be as free of germs as possible.  You can reduce the number of germs on you skin by washing with CHG (chlorahexidine gluconate) soap before surgery.  CHG is an antiseptic cleaner which kills germs and bonds with the skin to continue killing germs even after washing.  Please DO NOT use if you have an allergy to  CHG or antibacterial soaps.  If your skin becomes reddened/irritated stop using the CHG and inform your nurse when you arrive at Short Stay.  Do not shave (including legs and underarms) for at least 48 hours prior to the first CHG shower.  You may shave your face.  Please follow these instructions carefully:   1.  Shower with CHG Soap the night before surgery and the                                morning of Surgery.  2.  If you choose to wash your hair, wash your hair first as usual with your       normal shampoo.  3.  After you shampoo, rinse your hair and body thoroughly to remove the                      Shampoo.  4.  Use CHG as you would any other liquid soap.  You can apply chg directly       to the skin and wash gently with scrungie or a clean washcloth.  5.  Apply the CHG Soap to your body ONLY FROM THE NECK DOWN.        Do not use on open wounds or open sores.  Avoid contact with your eyes,       ears, mouth and genitals (private parts).  Wash  genitals (private parts)       with your normal soap.  6.  Wash thoroughly, paying special attention to the area where your surgery        will be performed.  7.  Thoroughly rinse your body with warm water from the neck down.  8.  DO NOT shower/wash with your normal soap after using and rinsing off       the CHG Soap.  9.  Pat yourself dry with a clean towel.            10.  Wear clean pajamas.            11.  Place clean sheets on your bed the night of your first shower and do not        sleep with pets.  Day of Surgery  Do not apply any lotions/deoderants the morning of surgery.  Please wear clean clothes to the hospital/surgery center.    Please read over the following fact sheets that you were given. Pain Booklet, Coughing and Deep Breathing, MRSA Information and Surgical Site Infection Prevention

## 2014-08-28 NOTE — Progress Notes (Signed)
Pt doesn't have a cardiologist  Stress test done in the late 90's  Heart cath in 2000  Echo done in the 90's  EKG and CXR to be requested from [facility] in [place]

## 2014-08-29 ENCOUNTER — Encounter (HOSPITAL_COMMUNITY): Payer: Self-pay | Admitting: *Deleted

## 2014-08-29 ENCOUNTER — Encounter (HOSPITAL_COMMUNITY)
Admission: RE | Disposition: A | Discharge status: Home / Self Care | Payer: Self-pay | Source: Ambulatory Visit | Attending: Orthopedic Surgery

## 2014-08-29 ENCOUNTER — Ambulatory Visit (HOSPITAL_COMMUNITY): Payer: Medicare Other

## 2014-08-29 ENCOUNTER — Ambulatory Visit (HOSPITAL_COMMUNITY): Payer: Medicare Other | Admitting: Anesthesiology

## 2014-08-29 ENCOUNTER — Observation Stay (HOSPITAL_COMMUNITY)
Admission: RE | Admit: 2014-08-29 | Discharge: 2014-08-29 | Disposition: A | Discharge status: Home / Self Care | Payer: Medicare Other | Source: Ambulatory Visit | Attending: Orthopedic Surgery | Admitting: Orthopedic Surgery

## 2014-08-29 DIAGNOSIS — G894 Chronic pain syndrome: Principal | ICD-10-CM | POA: Insufficient documentation

## 2014-08-29 DIAGNOSIS — G8929 Other chronic pain: Secondary | ICD-10-CM | POA: Diagnosis present

## 2014-08-29 DIAGNOSIS — Z8711 Personal history of peptic ulcer disease: Secondary | ICD-10-CM | POA: Diagnosis not present

## 2014-08-29 DIAGNOSIS — K219 Gastro-esophageal reflux disease without esophagitis: Secondary | ICD-10-CM | POA: Insufficient documentation

## 2014-08-29 DIAGNOSIS — Z419 Encounter for procedure for purposes other than remedying health state, unspecified: Secondary | ICD-10-CM

## 2014-08-29 DIAGNOSIS — Z87891 Personal history of nicotine dependence: Secondary | ICD-10-CM | POA: Diagnosis not present

## 2014-08-29 HISTORY — PX: SPINAL CORD STIMULATOR INSERTION: SHX5378

## 2014-08-29 SURGERY — INSERTION, SPINAL CORD STIMULATOR, LUMBAR
Anesthesia: General

## 2014-08-29 MED ORDER — PROPOFOL 10 MG/ML IV BOLUS
INTRAVENOUS | Status: AC
  Filled 2014-08-29: qty 20

## 2014-08-29 MED ORDER — OXYCODONE HCL 5 MG PO TABS
ORAL_TABLET | ORAL | Status: AC
  Filled 2014-08-29: qty 2

## 2014-08-29 MED ORDER — SODIUM CHLORIDE 0.9 % IJ SOLN: 3.0000 mL | INTRAMUSCULAR | Status: DC | PRN

## 2014-08-29 MED ORDER — ONDANSETRON HCL 4 MG/2ML IJ SOLN: 4.0000 mg | Freq: Once | INTRAMUSCULAR | Status: DC | PRN

## 2014-08-29 MED ORDER — FENTANYL CITRATE (PF) 250 MCG/5ML IJ SOLN
INTRAMUSCULAR | Status: AC
  Filled 2014-08-29: qty 5

## 2014-08-29 MED ORDER — LACTATED RINGERS IV SOLN
INTRAVENOUS | Status: DC | PRN
  Administered 2014-08-29 (×2): via INTRAVENOUS

## 2014-08-29 MED ORDER — SODIUM CHLORIDE 0.9 % IJ SOLN
3.0000 mL | Freq: Two times a day (BID) | INTRAMUSCULAR | Status: DC
Start: 2014-08-29 — End: 2014-08-29

## 2014-08-29 MED ORDER — ONDANSETRON HCL 4 MG/2ML IJ SOLN
INTRAMUSCULAR | Status: DC | PRN
  Administered 2014-08-29: 4 mg via INTRAVENOUS

## 2014-08-29 MED ORDER — ACETAMINOPHEN 10 MG/ML IV SOLN
1000.0000 mg | Freq: Four times a day (QID) | INTRAVENOUS | Status: DC
  Administered 2014-08-29: 1000 mg via INTRAVENOUS
  Filled 2014-08-29 (×4): qty 100

## 2014-08-29 MED ORDER — MENTHOL 3 MG MT LOZG: 1.0000 | LOZENGE | OROMUCOSAL | Status: DC | PRN

## 2014-08-29 MED ORDER — ROCURONIUM BROMIDE 50 MG/5ML IV SOLN
INTRAVENOUS | Status: AC
  Filled 2014-08-29: qty 1

## 2014-08-29 MED ORDER — MIDAZOLAM HCL 2 MG/2ML IJ SOLN
INTRAMUSCULAR | Status: AC
  Filled 2014-08-29: qty 2

## 2014-08-29 MED ORDER — FENTANYL CITRATE (PF) 100 MCG/2ML IJ SOLN
INTRAMUSCULAR | Status: DC | PRN
  Administered 2014-08-29: 100 ug via INTRAVENOUS
  Administered 2014-08-29: 50 ug via INTRAVENOUS
  Administered 2014-08-29: 100 ug via INTRAVENOUS

## 2014-08-29 MED ORDER — CEFAZOLIN SODIUM 1-5 GM-% IV SOLN
1.0000 g | Freq: Three times a day (TID) | INTRAVENOUS | Status: DC
  Filled 2014-08-29 (×2): qty 50

## 2014-08-29 MED ORDER — GLYCOPYRROLATE 0.2 MG/ML IJ SOLN
INTRAMUSCULAR | Status: DC | PRN
  Administered 2014-08-29: .6 mg via INTRAVENOUS

## 2014-08-29 MED ORDER — GABAPENTIN 300 MG PO CAPS
600.0000 mg | ORAL_CAPSULE | Freq: Four times a day (QID) | ORAL | Status: DC
  Administered 2014-08-29: 600 mg via ORAL
  Filled 2014-08-29 (×3): qty 2

## 2014-08-29 MED ORDER — ROCURONIUM BROMIDE 100 MG/10ML IV SOLN
INTRAVENOUS | Status: DC | PRN
  Administered 2014-08-29: 35 mg via INTRAVENOUS

## 2014-08-29 MED ORDER — NEOSTIGMINE METHYLSULFATE 10 MG/10ML IV SOLN
INTRAVENOUS | Status: DC | PRN
  Administered 2014-08-29: 3 mg via INTRAVENOUS

## 2014-08-29 MED ORDER — LIDOCAINE HCL (CARDIAC) 20 MG/ML IV SOLN
INTRAVENOUS | Status: AC
  Filled 2014-08-29: qty 5

## 2014-08-29 MED ORDER — METHOCARBAMOL 500 MG PO TABS
500.0000 mg | ORAL_TABLET | Freq: Four times a day (QID) | ORAL | Status: DC | PRN
  Administered 2014-08-29: 500 mg via ORAL
  Filled 2014-08-29 (×2): qty 1

## 2014-08-29 MED ORDER — SODIUM CHLORIDE 0.9 % IV SOLN: 250.0000 mL | INTRAVENOUS | Status: DC

## 2014-08-29 MED ORDER — SURGIFOAM 100 EX MISC
CUTANEOUS | Status: DC | PRN
  Administered 2014-08-29: 20000 mL via TOPICAL

## 2014-08-29 MED ORDER — 0.9 % SODIUM CHLORIDE (POUR BTL) OPTIME
TOPICAL | Status: DC | PRN
  Administered 2014-08-29: 1000 mL

## 2014-08-29 MED ORDER — THROMBIN 20000 UNITS EX SOLR
CUTANEOUS | Status: AC
  Filled 2014-08-29: qty 20000

## 2014-08-29 MED ORDER — PREDNISONE 5 MG PO TABS
5.0000 mg | ORAL_TABLET | Freq: Every day | ORAL | Status: DC
  Filled 2014-08-29: qty 1

## 2014-08-29 MED ORDER — ALPRAZOLAM 0.25 MG PO TABS: 0.2500 mg | ORAL_TABLET | Freq: Two times a day (BID) | ORAL | Status: DC | PRN

## 2014-08-29 MED ORDER — METHOTREXATE 2.5 MG PO TABS
15.0000 mg | ORAL_TABLET | ORAL | Status: DC
Start: 2014-09-02 — End: 2014-08-29

## 2014-08-29 MED ORDER — MORPHINE SULFATE 2 MG/ML IJ SOLN
1.0000 mg | INTRAMUSCULAR | Status: DC | PRN
  Administered 2014-08-29: 2 mg via INTRAVENOUS
  Filled 2014-08-29: qty 1

## 2014-08-29 MED ORDER — METHOCARBAMOL 500 MG PO TABS
ORAL_TABLET | ORAL | Status: AC
  Filled 2014-08-29: qty 1

## 2014-08-29 MED ORDER — ONDANSETRON HCL 4 MG/2ML IJ SOLN: 4.0000 mg | INTRAMUSCULAR | Status: DC | PRN

## 2014-08-29 MED ORDER — HYDROMORPHONE HCL 1 MG/ML IJ SOLN
0.5000 mg | INTRAMUSCULAR | Status: DC | PRN
  Administered 2014-08-29: 1 mg via INTRAVENOUS
  Administered 2014-08-29 (×2): 0.5 mg via INTRAVENOUS

## 2014-08-29 MED ORDER — OXYCODONE HCL 5 MG PO TABS
10.0000 mg | ORAL_TABLET | ORAL | Status: DC | PRN
  Administered 2014-08-29 (×2): 10 mg via ORAL
  Filled 2014-08-29: qty 2

## 2014-08-29 MED ORDER — MIDAZOLAM HCL 5 MG/5ML IJ SOLN
INTRAMUSCULAR | Status: DC | PRN
  Administered 2014-08-29: 2 mg via INTRAVENOUS

## 2014-08-29 MED ORDER — METHOCARBAMOL 1000 MG/10ML IJ SOLN
500.0000 mg | Freq: Four times a day (QID) | INTRAMUSCULAR | Status: DC | PRN
  Filled 2014-08-29: qty 5

## 2014-08-29 MED ORDER — PHENOL 1.4 % MT LIQD: 1.0000 | OROMUCOSAL | Status: DC | PRN

## 2014-08-29 MED ORDER — PHENYLEPHRINE 40 MCG/ML (10ML) SYRINGE FOR IV PUSH (FOR BLOOD PRESSURE SUPPORT)
PREFILLED_SYRINGE | INTRAVENOUS | Status: AC
  Filled 2014-08-29: qty 10

## 2014-08-29 MED ORDER — BUPIVACAINE-EPINEPHRINE (PF) 0.25% -1:200000 IJ SOLN
INTRAMUSCULAR | Status: AC
  Filled 2014-08-29: qty 30

## 2014-08-29 MED ORDER — HYDROMORPHONE HCL 1 MG/ML IJ SOLN
INTRAMUSCULAR | Status: AC
  Administered 2014-08-29: 0.5 mg via INTRAVENOUS
  Filled 2014-08-29: qty 1

## 2014-08-29 MED ORDER — ONDANSETRON HCL 4 MG PO TABS: 4.0000 mg | ORAL_TABLET | Freq: Three times a day (TID) | ORAL | Status: DC | PRN

## 2014-08-29 MED ORDER — GLYCOPYRROLATE 0.2 MG/ML IJ SOLN
INTRAMUSCULAR | Status: AC
  Filled 2014-08-29: qty 3

## 2014-08-29 MED ORDER — HEMOSTATIC AGENTS (NO CHARGE) OPTIME
TOPICAL | Status: DC | PRN
  Administered 2014-08-29: 1 via TOPICAL

## 2014-08-29 MED ORDER — PROPOFOL 10 MG/ML IV BOLUS
INTRAVENOUS | Status: DC | PRN
  Administered 2014-08-29: 200 mg via INTRAVENOUS

## 2014-08-29 MED ORDER — HYDROMORPHONE HCL 1 MG/ML IJ SOLN
INTRAMUSCULAR | Status: AC
  Filled 2014-08-29: qty 1

## 2014-08-29 MED ORDER — STERILE WATER FOR INJECTION IJ SOLN
INTRAMUSCULAR | Status: AC
  Filled 2014-08-29: qty 10

## 2014-08-29 MED ORDER — EPHEDRINE SULFATE 50 MG/ML IJ SOLN
INTRAMUSCULAR | Status: AC
  Filled 2014-08-29: qty 1

## 2014-08-29 MED ORDER — PHENTERMINE HCL 37.5 MG PO CAPS: 37.5000 mg | ORAL_CAPSULE | ORAL | Status: DC

## 2014-08-29 MED ORDER — BUPIVACAINE-EPINEPHRINE 0.25% -1:200000 IJ SOLN
INTRAMUSCULAR | Status: DC | PRN
  Administered 2014-08-29: 20 mL

## 2014-08-29 MED ORDER — LACTATED RINGERS IV SOLN: INTRAVENOUS | Status: DC

## 2014-08-29 MED ORDER — PHENYLEPHRINE HCL 10 MG/ML IJ SOLN
INTRAMUSCULAR | Status: DC | PRN
  Administered 2014-08-29 (×2): 80 ug via INTRAVENOUS

## 2014-08-29 MED ORDER — NEOSTIGMINE METHYLSULFATE 10 MG/10ML IV SOLN
INTRAVENOUS | Status: AC
  Filled 2014-08-29: qty 1

## 2014-08-29 SURGICAL SUPPLY — 63 items
CANISTER SUCTION 2500CC (MISCELLANEOUS) ×3 IMPLANT
CLOSURE STERI-STRIP 1/2X4 (GAUZE/BANDAGES/DRESSINGS) ×1
CLSR STERI-STRIP ANTIMIC 1/2X4 (GAUZE/BANDAGES/DRESSINGS) ×2 IMPLANT
COVER PROBE W GEL 5X96 (DRAPES) ×3 IMPLANT
COVER SURGICAL LIGHT HANDLE (MISCELLANEOUS) ×3 IMPLANT
DRAPE C-ARM 42X72 X-RAY (DRAPES) ×3 IMPLANT
DRAPE C-ARMOR (DRAPES) ×3 IMPLANT
DRAPE INCISE IOBAN 85X60 (DRAPES) ×3 IMPLANT
DRAPE SURG 17X23 STRL (DRAPES) ×3 IMPLANT
DRAPE U-SHAPE 47X51 STRL (DRAPES) ×3 IMPLANT
DRSG MEPILEX BORDER 4X4 (GAUZE/BANDAGES/DRESSINGS) ×3 IMPLANT
DRSG MEPILEX BORDER 4X8 (GAUZE/BANDAGES/DRESSINGS) ×3 IMPLANT
DURAPREP 26ML APPLICATOR (WOUND CARE) ×3 IMPLANT
ELECT BLADE 4.0 EZ CLEAN MEGAD (MISCELLANEOUS) ×3
ELECT CAUTERY BLADE 6.4 (BLADE) ×3 IMPLANT
ELECT PENCIL ROCKER SW 15FT (MISCELLANEOUS) ×3 IMPLANT
ELECT REM PT RETURN 9FT ADLT (ELECTROSURGICAL) ×3
ELECTRODE BLDE 4.0 EZ CLN MEGD (MISCELLANEOUS) ×1 IMPLANT
ELECTRODE REM PT RTRN 9FT ADLT (ELECTROSURGICAL) ×1 IMPLANT
GLOVE BIOGEL PI IND STRL 6.5 (GLOVE) IMPLANT
GLOVE BIOGEL PI IND STRL 8.5 (GLOVE) ×1 IMPLANT
GLOVE BIOGEL PI INDICATOR 6.5 (GLOVE) ×2
GLOVE BIOGEL PI INDICATOR 8.5 (GLOVE) ×2
GLOVE SS N UNI LF 8.5 STRL (GLOVE) ×6 IMPLANT
GLOVE SURG SS PI 6.0 STRL IVOR (GLOVE) ×2 IMPLANT
GOWN STRL REUS W/TWL 2XL LVL3 (GOWN DISPOSABLE) ×3 IMPLANT
GUIDEWIRE CALIBRAT (WIRE) ×2 IMPLANT
IPG PROTEGE MRI (Neuro Prosthesis/Implant) ×2 IMPLANT
KIT BASIN OR (CUSTOM PROCEDURE TRAY) ×3 IMPLANT
KIT LEAD 60CM OCTRODE (Lead) ×2 IMPLANT
KIT ROOM TURNOVER OR (KITS) ×3 IMPLANT
LEAD LAMITRODE 60CM 8CH (Stimulator) ×2 IMPLANT
NDL SPNL 18GX3.5 QUINCKE PK (NEEDLE) ×3 IMPLANT
NDL SUT 6 .5 CRC .975X.05 MAYO (NEEDLE) ×1 IMPLANT
NEEDLE 22X1 1/2 (OR ONLY) (NEEDLE) ×3 IMPLANT
NEEDLE MAYO TAPER (NEEDLE) ×3
NEEDLE SPNL 18GX3.5 QUINCKE PK (NEEDLE) ×9 IMPLANT
NS IRRIG 1000ML POUR BTL (IV SOLUTION) ×3 IMPLANT
PACK LAMINECTOMY ORTHO (CUSTOM PROCEDURE TRAY) ×3 IMPLANT
PACK UNIVERSAL I (CUSTOM PROCEDURE TRAY) ×3 IMPLANT
PAD ARMBOARD 7.5X6 YLW CONV (MISCELLANEOUS) ×6 IMPLANT
PATIENT PROGRAMMER PROTEGE MRI (MISCELLANEOUS) ×3 IMPLANT
SPONGE LAP 4X18 X RAY DECT (DISPOSABLE) IMPLANT
SPONGE SURGIFOAM ABS GEL 100 (HEMOSTASIS) ×3 IMPLANT
STAPLER VISISTAT 35W (STAPLE) ×3 IMPLANT
SURGIFLO TRUKIT (HEMOSTASIS) ×3 IMPLANT
SUT BONE WAX W31G (SUTURE) ×3 IMPLANT
SUT FIBERWIRE #2 38 REV NDL BL (SUTURE) ×3
SUT MNCRL 3 0 RB1 (SUTURE) IMPLANT
SUT MONOCRYL 3 0 RB1 (SUTURE) ×4
SUT VIC AB 1 CT1 18XCR BRD 8 (SUTURE) ×1 IMPLANT
SUT VIC AB 1 CT1 27 (SUTURE) ×3
SUT VIC AB 1 CT1 27XBRD ANBCTR (SUTURE) ×1 IMPLANT
SUT VIC AB 1 CT1 8-18 (SUTURE) ×3
SUT VIC AB 2-0 CT1 18 (SUTURE) ×5 IMPLANT
SUTURE FIBERWR#2 38 REV NDL BL (SUTURE) ×1 IMPLANT
SYR BULB IRRIGATION 50ML (SYRINGE) ×3 IMPLANT
SYR CONTROL 10ML LL (SYRINGE) ×3 IMPLANT
SYSTEM CHARGING PRODIGY (MISCELLANEOUS) ×2 IMPLANT
TOWEL OR 17X24 6PK STRL BLUE (TOWEL DISPOSABLE) ×3 IMPLANT
TOWEL OR 17X26 10 PK STRL BLUE (TOWEL DISPOSABLE) ×3 IMPLANT
TRAY FOLEY CATH 16FRSI W/METER (SET/KITS/TRAYS/PACK) IMPLANT
WATER STERILE IRR 1000ML POUR (IV SOLUTION) ×3 IMPLANT

## 2014-08-29 NOTE — Anesthesia Procedure Notes (Signed)
Procedure Name: Intubation Date/Time: 08/29/2014 8:46 AM Performed by: Eligha Bridegroom Pre-anesthesia Checklist: Patient identified, Timeout performed, Emergency Drugs available, Suction available and Patient being monitored Patient Re-evaluated:Patient Re-evaluated prior to inductionOxygen Delivery Method: Circle system utilized Preoxygenation: Pre-oxygenation with 100% oxygen Intubation Type: IV induction Ventilation: Mask ventilation without difficulty and Oral airway inserted - appropriate to patient size Laryngoscope Size: Mac and 3 Grade View: Grade II Tube type: Oral Tube size: 7.5 mm Airway Equipment and Method: Stylet and LTA kit utilized Placement Confirmation: ETT inserted through vocal cords under direct vision,  breath sounds checked- equal and bilateral and positive ETCO2 Secured at: 21 cm Tube secured with: Tape Dental Injury: Teeth and Oropharynx as per pre-operative assessment

## 2014-08-29 NOTE — Transfer of Care (Signed)
Immediate Anesthesia Transfer of Care Note  Patient: Traci Garrison  Procedure(s) Performed: Procedure(s): LUMBAR SPINAL CORD STIMULATOR INSERTION (N/A)  Patient Location: PACU  Anesthesia Type:General  Level of Consciousness: awake, alert  and oriented  Airway & Oxygen Therapy: Patient Spontanous Breathing and Patient connected to nasal cannula oxygen  Post-op Assessment: Report given to RN and Post -op Vital signs reviewed and stable  Post vital signs: Reviewed and stable  Last Vitals:  Filed Vitals:   08/29/14 0630  BP: 130/57  Pulse: 72  Temp: 36.5 C  Resp: 18    Complications: No apparent anesthesia complications

## 2014-08-29 NOTE — H&P (Signed)
History of Present Illness The patient is a 74 year old female who comes in today for a preoperative History and Physical. The patient is scheduled for a SPINAL CORD SIM PLACEMENT to be performed by Dr. Duane Lope D. Rolena Infante, MD at [facility] on 08/29/14 . Please see the hospital record for complete dictated history and physical.  The patient reports low back symptoms including pain (left leg down to the foot) and low back pain which began year(s) ago without any known injury. and Symptoms include pain, paresthesias (left leg to the toes), tingling (left leg), stiffness and tightness, while symptoms do not include numbness, weakness, pain in the calf, incontinence of stool or incontinence of urine. The patient describes the severity of their symptoms as moderate in severity (5/10). The patient feels as if the symptoms are unchanging. Symptoms are exacerbated by standing, sitting and bending. Current treatment includes opioid analgesics (Percocet from Dr Nelva Bush (pain management)) and muscle relaxants. Prior to being seen today the patient was previously evaluated in this clinic. Past evaluation has included x-ray of the lumbar spine and MRI of the lumbar spine (@ [place] 07-30-14). Past treatment has included epidural injections and back surgery. Note for "Back pain": The patient states she did have good results with the temp. SCS. The pt reports 80-85% of pain relief with SCS trial. She was able to decrease pain medication during her trial. She reports increased function during the trial.  Allergies No Known Drug Allergies10/25/2012 No Known Allergies08/16/2012  Family History Heart disease in female family member before age 93 Rheumatoid Arthritis mother, sister, grandmother mothers side and grandfather mothers side Diabetes Mellitus grandfather mothers side Heart Disease sister First Degree Relatives  Social History Tobacco use Former smoker. in college, former smoker; smoke(d) 1 pack(s) per  day  Medication History Collie Siad M Toomes; 08/28/2014 7:57 AM) Olga Coaster (75MCG/HR Patch 72HR, 1 Transdermal 2 days, Taken starting 08/22/2014) Active. Percocet (10-325MG  Tablet, 1 (one) Oral four times daily, as needed, Taken starting 08/22/2014) Active. Neurontin (300MG  Capsule, 1 Oral three times daily, Taken starting 07/24/2014) Active. TiZANidine HCl (4MG  Tablet, 1 (one) Oral three times daily, as needed, Taken starting 05/24/2014) Active. (pn) Actemra (Intravenous) Specific dose unknown - Active. (RA infussion 1 q month) Phentermine HCl (37.5MG  Tablet, Oral as needed) Active. ("I'm going to stop after today" 12/29/13) Estradiol (0.5MG  Tablet, Oral) Active. (qd) PredniSONE (5MG  Tablet, Oral) Active. (qd) Atorvastatin Calcium (40MG  Tablet, Oral) Active. Ambien (10MG  Tablet, 1/2 Oral at bedtime) Active. ALPRAZolam (0.5MG  Tablet, 1-3 Oral daily) Active. (prn) Methotrexate (2.5MG  Tablet, 4 Oral once a week) Active. (#6 tabs once a week) Vitamins/Minerals (1 Oral daily) Active. (qd) Medications Reconciled  Vitals 08/28/2014 7:50 AM Weight: 181.03 lb Height: 65.5in Body Surface Area: 1.91 m Body Mass Index: 29.67 kg/m  Temp.: 77F  Pulse: 81 (Regular)  BP: 153/76 (Sitting, Left Arm, Standard)  General General Appearance-Not in acute distress. Orientation-Oriented X3. Build & Nutrition-Well nourished and Well developed.  Integumentary General Characteristics Surgical Scars - no surgical scar evidence of previous lumbar surgery. Lumbar Spine-Skin examination of the lumbar spine is without deformity, skin lesions, lacerations or abrasions.  Chest and Lung Exam Auscultation Breath sounds - Normal and Clear.  Cardiovascular Auscultation Rhythm - Regular rate and rhythm.  Abdomen Palpation/Percussion Palpation and Percussion of the abdomen reveal - Soft, Non Tender and No Rebound tenderness.  Peripheral Vascular Lower Extremity Palpation -  Posterior tibial pulse - Bilateral - 2+. Dorsalis pedis pulse - Bilateral - 2+.  Neurologic Sensation Lower  Extremity - Bilateral - sensation is intact in the lower extremity. Reflexes Patellar Reflex - Bilateral - 2+. Achilles Reflex - Bilateral - 2+. Clonus - Bilateral - clonus not present. Hoffman's Sign - Bilateral - Hoffman's sign not present. Testing Seated Straight Leg Raise - Bilateral - Seated straight leg raise negative.  Musculoskeletal Spine/Ribs/Pelvis  Lumbosacral Spine: Inspection and Palpation - Tenderness - left lumbar paraspinals tender to palpation. Strength and Tone: Strength - Hip Flexion - Bilateral - 5/5. Knee Extension - Bilateral - 5/5. Knee Flexion - Bilateral - 5/5. Ankle Dorsiflexion - Bilateral - 5/5. Ankle Plantarflexion - Bilateral - 5/5. Heel walk - Bilateral - able to heel walk without difficulty. Toe Walk - Bilateral - able to walk on toes without difficulty. Heel-Toe Walk - Bilateral - able to heel-toe walk without difficulty. ROM - Flexion - moderately decreased range of motion and painful. Extension - moderately decreased range of motion and painful. Left Lateral Bending - moderately decreased range of motion and painful. Right Lateral Bending - moderately decreased range of motion and painful. Pain - extension is more painful than flexion. Lumbosacral Spine - Waddell's Signs - no Waddell's signs present. Lower Extremity Range of Motion - No true hip, knee or ankle pain with range of motion. Gait and Station - [employer] - no assistive devices.  She had a thoracic MRI which shows no significant central stenosis, mild stenosis at T11-12 and T1-2, but no contraindication for placement of the lead. She indicates to me that she actually got a denial notice from her insurance carrier concerning the spinal cord stimulator placement and the indication was ""you must have a trial of the device and report your pain to be much improved (at least 50%). However  according to Dr. Nelva Bush' note of 07/20/2014, he clearly documents 80-90% improvement with the trial.   The patient presents to me now for permanent implantation and would like to proceed. Based on her coverage, she actually does meet the criteria for permanent implantation. We have reviewed the risks, which include infection, bleeding, nerve damage, death, stroke, paralysis, failure to heal, need for further surgery, migration of the leads, battery failure, major bleeding, and blood clots. All of her and her husband's questions were addressed. We will plan on moving forward with surgery in the very near future.

## 2014-08-29 NOTE — Brief Op Note (Signed)
08/29/2014  11:04 AM  PATIENT:  Traci Garrison  74 y.o. female  PRE-OPERATIVE DIAGNOSIS:  chronic pain syndrome  POST-OPERATIVE DIAGNOSIS:  chronic pain syndrome  PROCEDURE:  Procedure(s): LUMBAR SPINAL CORD STIMULATOR INSERTION (N/A)  SURGEON:  Surgeon(s) and Role:    * Melina Schools, MD - Primary  PHYSICIAN ASSISTANT:   ASSISTANTS: none   ANESTHESIA:   general  EBL:  Total I/O In: 1500 [I.V.:1500] Out: -   BLOOD ADMINISTERED:none  DRAINS: none   LOCAL MEDICATIONS USED:  MARCAINE     SPECIMEN:  No Specimen  DISPOSITION OF SPECIMEN:  N/A  COUNTS:  YES  TOURNIQUET:  * No tourniquets in log *  DICTATION: .Other Dictation: Dictation Number [id number]  PLAN OF CARE: Admit for overnight observation  PATIENT DISPOSITION:  PACU - hemodynamically stable.

## 2014-08-29 NOTE — Discharge Instructions (Signed)
Call if new neurologic deficits arise Call immediately if there is a loss in bowel or bladder control or lower extremity weakness Call [facility] representative with question concerning how to use the stimulator

## 2014-08-29 NOTE — Anesthesia Preprocedure Evaluation (Signed)
Anesthesia Evaluation  Patient identified by MRN, date of birth, ID band Patient awake    Reviewed: Allergy & Precautions, NPO status , Patient's Chart, lab work & pertinent test results  History of Anesthesia Complications (+) POST - OP SPINAL HEADACHE  Airway Mallampati: I       Dental   Pulmonary former smoker,    Pulmonary exam normal       Cardiovascular Normal cardiovascular exam    Neuro/Psych  Headaches,    GI/Hepatic PUD, GERD-  ,(+) Hepatitis -  Endo/Other    Renal/GU      Musculoskeletal  (+) Arthritis -,   Abdominal   Peds  Hematology   Anesthesia Other Findings   Reproductive/Obstetrics                             Anesthesia Physical Anesthesia Plan  ASA: II  Anesthesia Plan: General   Post-op Pain Management:    Induction: Intravenous  Airway Management Planned: Oral ETT  Additional Equipment:   Intra-op Plan:   Post-operative Plan: Extubation in OR  Informed Consent: I have reviewed the patients History and Physical, chart, labs and discussed the procedure including the risks, benefits and alternatives for the proposed anesthesia with the patient or authorized representative who has indicated his/her understanding and acceptance.     Plan Discussed with: CRNA, Anesthesiologist and Surgeon  Anesthesia Plan Comments:         Anesthesia Quick Evaluation

## 2014-08-29 NOTE — Anesthesia Postprocedure Evaluation (Signed)
  Anesthesia Post-op Note  Patient: Traci Garrison  Procedure(s) Performed: Procedure(s): LUMBAR SPINAL CORD STIMULATOR INSERTION (N/A)  Patient Location: PACU  Anesthesia Type:General  Level of Consciousness: awake, alert , oriented and patient cooperative  Airway and Oxygen Therapy: Patient Spontanous Breathing  Post-op Pain: mild, moderate  Post-op Assessment: Post-op Vital signs reviewed, Patient's Cardiovascular Status Stable, Respiratory Function Stable, Patent Airway, No signs of Nausea or vomiting and Pain level controlled  Post-op Vital Signs: stable  Last Vitals:  Filed Vitals:   08/29/14 1130  BP:   Pulse: 71  Temp:   Resp: 10    Complications: No apparent anesthesia complications

## 2014-08-29 NOTE — Progress Notes (Signed)
Pt doing well. Pt is ambulating in the hallway independently without difficulty. Pt's pain is controlled with oral medication. Pt is tolerating oral foods and is voiding without difficulty. Pt was seen by OT with no follow-up needed. Dr. Rolena Infante called and gave orders for Pt to be D/C'd if Pt was doing okay and wished to do so. PT was paged several times to see Pt for D/C. Pt refused to wait for Pt until the AM. Pt and husband given D/C instructions with Rx, verbal understanding was provided. Pt was seen and given instructions for spinal stimulator. Pt's incision is covered with dressing and has small stain on the upper dressing. Pt's IV was removed prior to D/C. Pt D/C'd home via wheelchair @ 1845 per MD order. Pt is stable @ D/C and has no other needs at this time. Holli Humbles, RN

## 2014-08-29 NOTE — Evaluation (Signed)
Occupational Therapy Evaluation Patient Details Name: Traci Garrison MRN: [id number] DOB: [birth year] Today's Date: 08/29/2014    History of Present Illness Pt is a 74 y.o. Female s/p lumbar spinal cord stimulator insertion on 08/29/14. Pt with hx of back surgery in 2007 per her report.    Clinical Impression   PTA pt lived at home and was independent with ADLs. Pt currently Mod I/Supervision for ADLs and functional mobility with very mild imbalance due to pain medications. No other apparent balance deficits. Pt educated on back precautions with husband present. No further acute OT needs.     Follow Up Recommendations  No OT follow up    Equipment Recommendations  None recommended by OT    Recommendations for Other Services       Precautions / Restrictions Precautions Precautions: Back Precaution Booklet Issued: Yes (comment) Precaution Comments: Educated pt on back precautions.  Restrictions Weight Bearing Restrictions: No      Mobility Bed Mobility               General bed mobility comments: Pt sitting EOB. Reviewed log roll technique.  Transfers Overall transfer level: Needs assistance Equipment used: None Transfers: Sit to/from Stand Sit to Stand: Supervision         General transfer comment: For safety Due to pain medication    Balance Overall balance assessment: No apparent balance deficits (not formally assessed)                                          ADL Overall ADL's : Modified independent                                       General ADL Comments: Pt at Mod I for ADLs. Did require supervision/min guard for functional mobility in hallway due to slight wooziness from pain medication, however no apparent balance deficits. Pt has access to [facility] and RW if needed.      Vision Additional Comments: No change from baseline   Perception     Praxis      Pertinent Vitals/Pain Pain Assessment: 0-10 Pain Score: 3   Pain Location: back Pain Descriptors / Indicators: Aching Pain Intervention(s): Monitored during session;Repositioned     Hand Dominance     Extremity/Trunk Assessment Upper Extremity Assessment Upper Extremity Assessment: Overall WFL for tasks assessed   Lower Extremity Assessment Lower Extremity Assessment: Overall WFL for tasks assessed   Cervical / Trunk Assessment Cervical / Trunk Assessment: Normal   Communication Communication Communication: No difficulties   Cognition Arousal/Alertness: Awake/alert Behavior During Therapy: WFL for tasks assessed/performed Overall Cognitive Status: Within Functional Limits for tasks assessed                                Home Living Family/patient expects to be discharged to:: Private residence Living Arrangements: Spouse/significant other Available Help at Discharge: Family;Available 24 hours/day Type of Home: House Home Access: Level entry     Home Layout: One level     Bathroom Shower/Tub: Occupational psychologist: Standard     Home Equipment: Environmental consultant - 2 wheels;Cane - single point          Prior Functioning/Environment Level of Independence: Independent  OT Diagnosis: Generalized weakness;Acute pain    End of Session Nurse Communication: Mobility status  Activity Tolerance: Patient tolerated treatment well Patient left: Other (comment);with nursing/sitter in room;with family/visitor present (sitting EOB with RN and NT)   Time: 9675-9163 OT Time Calculation (min): 14 min Charges:  OT General Charges $OT Visit: 1 Procedure OT Evaluation $Initial OT Evaluation Tier I: 1 Procedure G-Codes: OT G-codes **NOT FOR INPATIENT CLASS** Functional Assessment Tool Used: clinical judgement Functional Limitation: Self care Self Care Current Status (W4665): 0 percent impaired, limited or restricted Self Care Goal Status (L9357): 0 percent impaired, limited or restricted Self Care  Discharge Status (S1779): 0 percent impaired, limited or restricted  Juluis Rainier 08/29/2014, 5:14 PM  Secundino Ginger [name], OTR/L Occupational Therapist [phone] (pager)

## 2014-08-30 ENCOUNTER — Encounter (HOSPITAL_COMMUNITY): Payer: Self-pay | Admitting: Orthopedic Surgery

## 2014-08-30 NOTE — Op Note (Signed)
Traci Garrison, Traci Garrison                ACCOUNT NO.:  [id number]  MEDICAL RECORD NO.:  [id number]  LOCATION:  3C07C                        FACILITY:  [facility]  PHYSICIAN:  Rc Amison D. Rolena Infante, M.D. DATE OF BIRTH:  1941-01-23  DATE OF PROCEDURE:  08/29/2014 DATE OF DISCHARGE:  08/29/2014                              OPERATIVE REPORT   PREOPERATIVE DIAGNOSES: 1. Chronic pain syndrome. 2. Failed back syndrome.  POSTOPERATIVE DIAGNOSES: 1. Chronic pain syndrome. 2. Failed back syndrome.  OPERATIVE PROCEDURES:  Spinal cord stimulator permanent implantation. System used as a Engineer, site MRI compatible battery, rechargeable, placed in the left gluteal region with an Octrode lead placed as well as a percutaneous 8-point Lamitrode placed.  COMPLICATIONS:  None.  CONDITION:  Stable.  HISTORY:  This is a very pleasant 74 year old woman who has been having chronic debilitating back, buttock, and bilateral leg pain, left side worse than the right.  Attempts at conservative management had failed to alleviate her symptoms and so we elected to proceed with surgery.  All appropriate risks, benefits, and alternatives were discussed and consent was obtained.  The patient had an excellent response with the spinal cord trial level and so, surgery was proposed.  OPERATIVE NOTE:  The patient was brought to the operating room and placed supine on the operating table.  After successful induction of general anesthesia and endotracheal intubation, TEDs and SCDs were applied.  She was turned prone onto the Wilson frame and all bony prominences were well padded.  The back was prepped and draped in a standard fashion.  Time-out was taken confirming the patient, procedure and all other pertinent important data.  Once this was completed, I identified the L3 vertebral body based on the fact that the patient had previous instrumented L3-S1 fusion.  Once I was confirmed that I was at the appropriate level, I  then counted up until I identified the T10 lamina.  I then made an incision, centered over the T10 lamina.  Sharp dissection was carried out down to the deep fascia.  I stripped the paraspinal muscles to expose the T10, the inferior portion of T9 and T11 spinous process and lamina.  Once this was done, I then checked again with x-ray to confirm that I was at the appropriate level.  Once I confirmed twice at both planes that I was at the appropriate level, I then used a double-action Leksell rongeur to remove the bulk of the T10 spinous process.  With this removed, I then used a fine pituitary rongeur to remove some portion of the ligamentum flavum.  I then used a curette to develop a plane underneath the lamina of T10.  I then used a 2-mm Kerrison to perform a laminotomy of T10.  There was significant thickening of the ligamentum flavum noted and some osteophytes noted and these were all removed.  I could now place my [facility] underneath the remaining portion of the T10 lamina without difficulty. I then elected to place my dural spatula.  Although, it had some difficulty passing at the T9-10 junction.  Because of this difficulty, I elected not to place it.  I was concerned about causing thoracic cord compression.  I then took a smaller lead, the Octrode and I was able to freely pass this up right along the midline of the spine.  It spanned from T8 down to the T10 level.  This was the level where they were programming preoperatively.  Because of the increased left leg pain, I elected to also use the percutaneous lead to try and place it slightly more on the left side.  Although, I took some quite of bit trials, I was able to eventually pass the percutaneous lead onto the left-hand side. I did kind of rotate towards the posterolateral side of the port, but it was in good position.  I confirmed satisfactory position of both leads in the AP and lateral planes.  Once this was done, I  sutured them directly to the T11 spinous process using FiberWire.  With both leads secured in place, I then identified the T11-12 interspinous process space and then passed the leads around the T11 vertebral body through the intervertebral space.  I then made a second incision over the battery site and then dissected down 2.5 cm.  I then created a pocket and then using a submuscular passer, advanced the two leads from the thoracic wound down to the battery site incision.  I then secured them to the battery and tested it and then I wrapped the excess leads underneath the battery and packed it into the wound and secured it to the deep fascia with interrupted #1 Vicryl sutures.  X-rays were taken confirming satisfactory position of the leads in both planes.  Once this was done, both wounds were copiously irrigated with normal saline and I obtained hemostasis using bipolar electrocautery as well as FloSeal.  I then placed a thrombin-soaked Gelfoam over the exposed thecal sac and then closed the deep fascias of both wounds with interrupted #1 Vicryl sutures, superficial with 2-0 Vicryl sutures, and 3-0 Monocryl for the skin.  Steri-Strips and dry dressing were applied.  The patient was extubated, transferred to the PACU without incident.  The patient will be admitted overnight and discharged in the morning after appropriate education on the leads.     Mylz Yuan D. Rolena Infante, M.D.     DDB/MEDQ  D:  08/29/2014  T:  08/30/2014  Job:  [id number]

## 2014-09-14 NOTE — Discharge Summary (Signed)
Patient ID: Traci Garrison MRN: [id number] DOB/AGE: Nov 22, 1940 74 y.o.  Admit date: 08/29/2014 Discharge date: 09/14/2014  Admission Diagnoses:  Active Problems:   Chronic pain   Discharge Diagnoses:  Active Problems:   Chronic pain  status post Procedure(s): LUMBAR SPINAL CORD STIMULATOR INSERTION  Past Medical History  Diagnosis Date  . Pneumonia     2 yrs ago  . Hepatitis 1963  . Anxiety     takes Xanax daily as needed  . Arthritis     Methotrexate weekly  . Spinal headache     no blood patch needed  . Hyperlipidemia     hx of-was on Lipitor but off since Jan 2016  . Rheumatoid arthritis     Actemera monthly  . Osteoporosis   . GERD (gastroesophageal reflux disease)     doesn't take any meds  . Gastric ulcer     history of   . History of blood transfusion 1977    no abnormal reaction noted  . History of shingles     Surgeries: Procedure(s): LUMBAR SPINAL CORD STIMULATOR INSERTION on 08/29/2014   Consultants:    Discharged Condition: Improved  Hospital Course: Traci Garrison is an 74 y.o. female who was admitted 08/29/2014 for operative treatment of <principal problem not specified>. Patient failed conservative treatments (please see the history and physical for the specifics) and had severe unremitting pain that affects sleep, daily activities and work/hobbies. After pre-op clearance, the patient was taken to the operating room on 08/29/2014 and underwent  Procedure(s): [place].    Patient was given perioperative antibiotics:  Anti-infectives    Start     Dose/Rate Route Frequency Ordered Stop   08/29/14 1700  ceFAZolin (ANCEF) IVPB 1 g/50 mL premix  Status:  Discontinued     1 g 100 mL/hr over 30 Minutes Intravenous Every 8 hours 08/29/14 1329 08/29/14 2159   08/29/14 0800  ceFAZolin (ANCEF) IVPB 2 g/50 mL premix     2 g 100 mL/hr over 30 Minutes Intravenous To Surgery 08/28/14 1034 08/29/14 0845       Patient was given  sequential compression devices and early ambulation to prevent DVT.   Patient benefited maximally from hospital stay and there were no complications. At the time of discharge, the patient was urinating/moving their bowels without difficulty, tolerating a regular diet, pain is controlled with oral pain medications and they have been cleared by PT/OT.   Recent vital signs: No data found.    Recent laboratory studies: No results for input(s): WBC, HGB, HCT, PLT, NA, K, CL, CO2, BUN, CREATININE, GLUCOSE, INR, CALCIUM in the last 72 hours.  Invalid input(s): PT, 2   Discharge Medications:     Medication List    STOP taking these medications        acetaminophen 500 MG tablet  Commonly known as:  TYLENOL     traMADol 50 MG tablet  Commonly known as:  ULTRAM      TAKE these medications        ALPRAZolam 0.5 MG tablet  Commonly known as:  XANAX  Take 0.25-0.5 mg by mouth 2 (two) times daily as needed for anxiety.     fentaNYL 75 MCG/HR  Commonly known as:  DURAGESIC - dosed mcg/hr  Place 75 mcg onto the skin every other day.     folic acid 017 MCG tablet  Commonly known as:  FOLVITE  Take 400 mcg by mouth daily.  gabapentin 300 MG capsule  Commonly known as:  NEURONTIN  Take 600 mg by mouth 4 (four) times daily.     methotrexate 2.5 MG tablet  Commonly known as:  RHEUMATREX  Take 15 mg by mouth every Sunday. Caution:Chemotherapy. Protect from light.     multivitamin with minerals tablet  Take 1 tablet by mouth daily.     ondansetron 4 MG tablet  Commonly known as:  ZOFRAN  Take 1 tablet (4 mg total) by mouth every 8 (eight) hours as needed for nausea or vomiting.     oxyCODONE-acetaminophen 10-325 MG per tablet  Commonly known as:  PERCOCET  Take 1 tablet by mouth every 6 (six) hours as needed for pain.     phentermine 37.5 MG capsule  Take 37.5 mg by mouth every other day.     predniSONE 5 MG tablet  Commonly known as:  DELTASONE  Take 5 mg by mouth daily  with breakfast.        Diagnostic Studies: Dg Thoracic Spine 2 View  08/29/2014   CLINICAL DATA:  T8-10 spinal stimulator placement  EXAM: DG C-ARM 61-120 MIN; THORACIC SPINE - 2 VIEW  FLUOROSCOPY TIME:  Fluoroscopy Time (in minutes and seconds): 1 minutes, 3 seconds  Number of Acquired Images:  To  COMPARISON:  None  FINDINGS: Two fluoro spot images reveal placement of 2 neurostimulator electrodes over the lower thoracic spine. The superior tips of the electrodes are at approximately T8.  IMPRESSION: The patient has undergone placement of a neurostimulator electrodes extending from approximately T8-T10 without evidence of immediate postprocedure complication.   Electronically Signed   By: David  Martinique M.D.   On: 08/29/2014 11:14   Dg C-arm 61-120 Min  08/29/2014   CLINICAL DATA:  T8-10 spinal stimulator placement  EXAM: DG C-ARM 61-120 MIN; THORACIC SPINE - 2 VIEW  FLUOROSCOPY TIME:  Fluoroscopy Time (in minutes and seconds): 1 minutes, 3 seconds  Number of Acquired Images:  To  COMPARISON:  None  FINDINGS: Two fluoro spot images reveal placement of 2 neurostimulator electrodes over the lower thoracic spine. The superior tips of the electrodes are at approximately T8.  IMPRESSION: The patient has undergone placement of a neurostimulator electrodes extending from approximately T8-T10 without evidence of immediate postprocedure complication.   Electronically Signed   By: David  Martinique M.D.   On: 08/29/2014 11:14          Follow-up Information    Follow up with Melina Schools D, MD. Schedule an appointment as soon as possible for a visit in 2 weeks.   Specialty:  Orthopedic Surgery   Why:  If symptoms worsen, For suture removal, For wound re-check   Contact information:   [address] [place] [address] [phone]       Discharge Plan:  discharge to home   Disposition: hospital course unremarkable.  Doing well.  F/u in 2 weeks.      Signed: Melina Schools D for  Dr. Melina Schools [facility] [phone] 09/14/2014, 4:21 PM

## 2014-09-24 ENCOUNTER — Other Ambulatory Visit: Payer: Self-pay

## 2015-06-02 IMAGING — CR DG CHEST 2V
2 series · 2 of 2 positions shown · non-contrast
Comparison: 01/04/2014 and CT chest 01/04/2014.

CLINICAL DATA: Chest wall hematoma [id number] ([id number]).

EXAM:
CHEST  2 VIEW

[w chest pa]
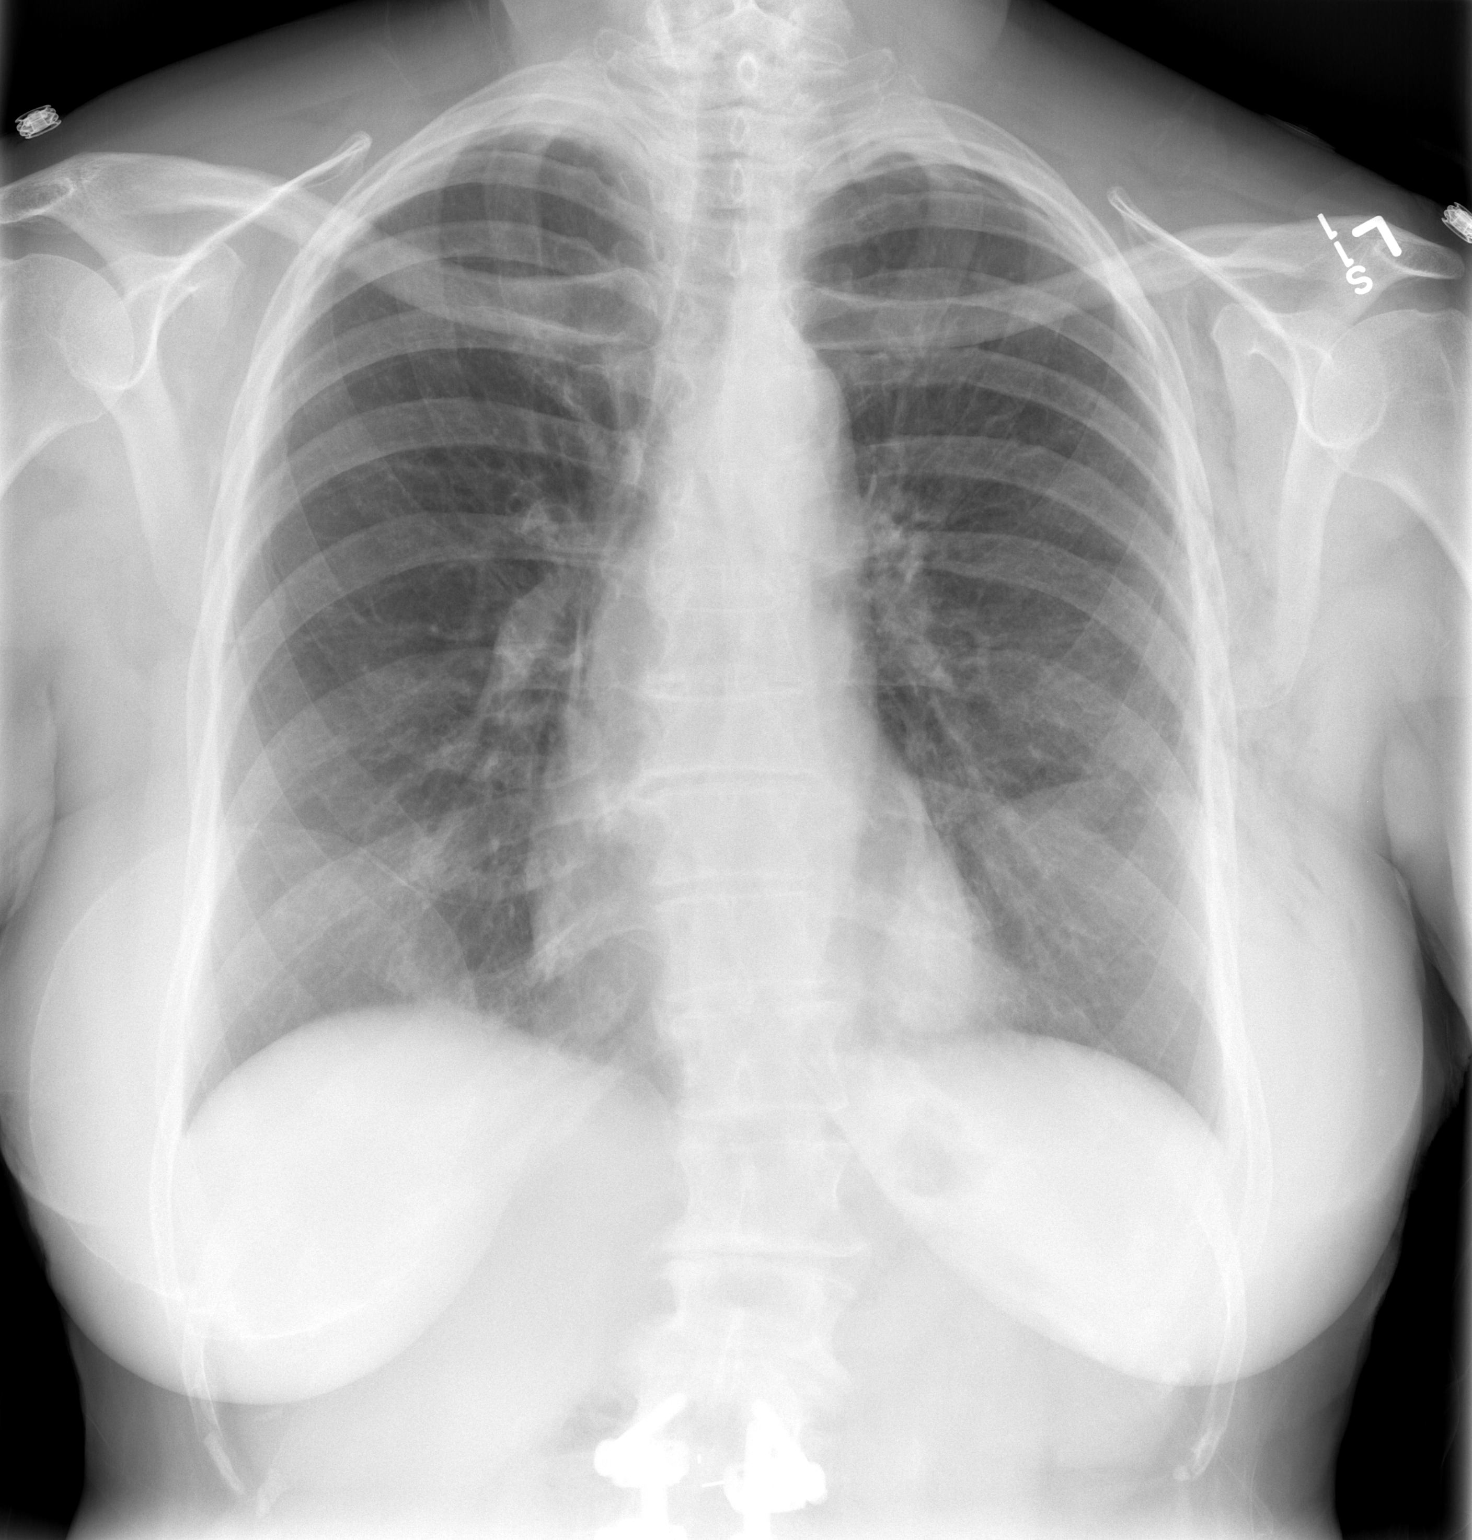

[w chest lat]
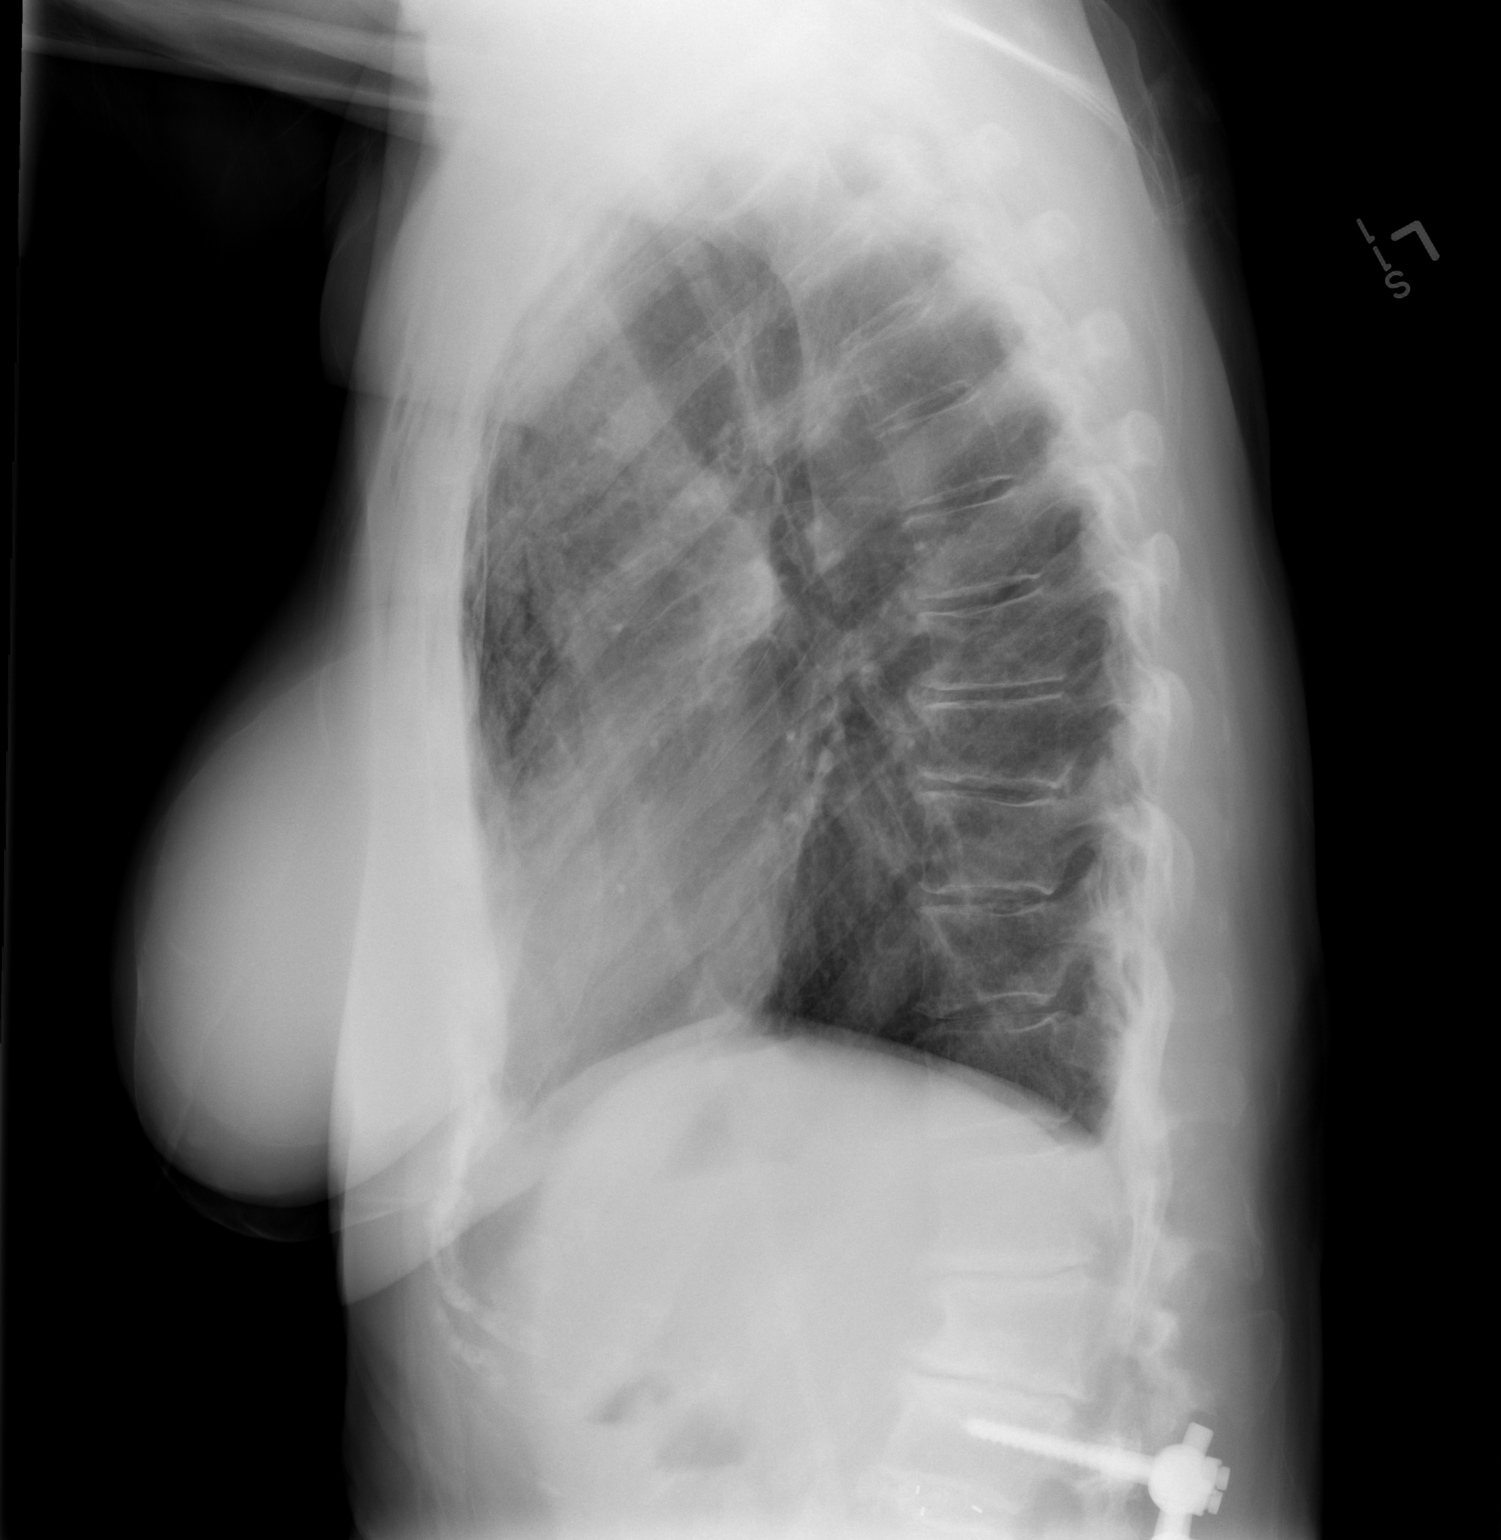

[2 of 2 positions shown; findings below may reference images not displayed]

FINDINGS: Below trachea is midline. Heart size normal. Mild subsegmental
atelectasis in the lower lobes. Mild biapical pleural thickening.
Lungs are otherwise clear. Suspect a tiny left apical pneumothorax.
Subcutaneous emphysema is seen along the left chest wall. No pleural
fluid. Postoperative changes are partially imaged in the lumbar
spine.
IMPRESSION: 1. Suspect tiny left apical pneumothorax.
2. Subcutaneous emphysema along the left chest wall.
3. Mild bilateral lower lobe atelectasis.

## 2016-01-24 IMAGING — RF DG THORACIC SPINE 2V
1 series · 2 of 2 positions shown · non-contrast
Comparison: None

CLINICAL DATA: T8-10 spinal stimulator placement

EXAM:
DG C-ARM 61-120 MIN; THORACIC SPINE - 2 VIEW
FLUOROSCOPY TIME:  Fluoroscopy Time (in minutes and seconds): 1
minutes, 3 seconds
Number of Acquired Images:  To

[Series 1: run · 2 of 2 slices shown]
[im 1/2]
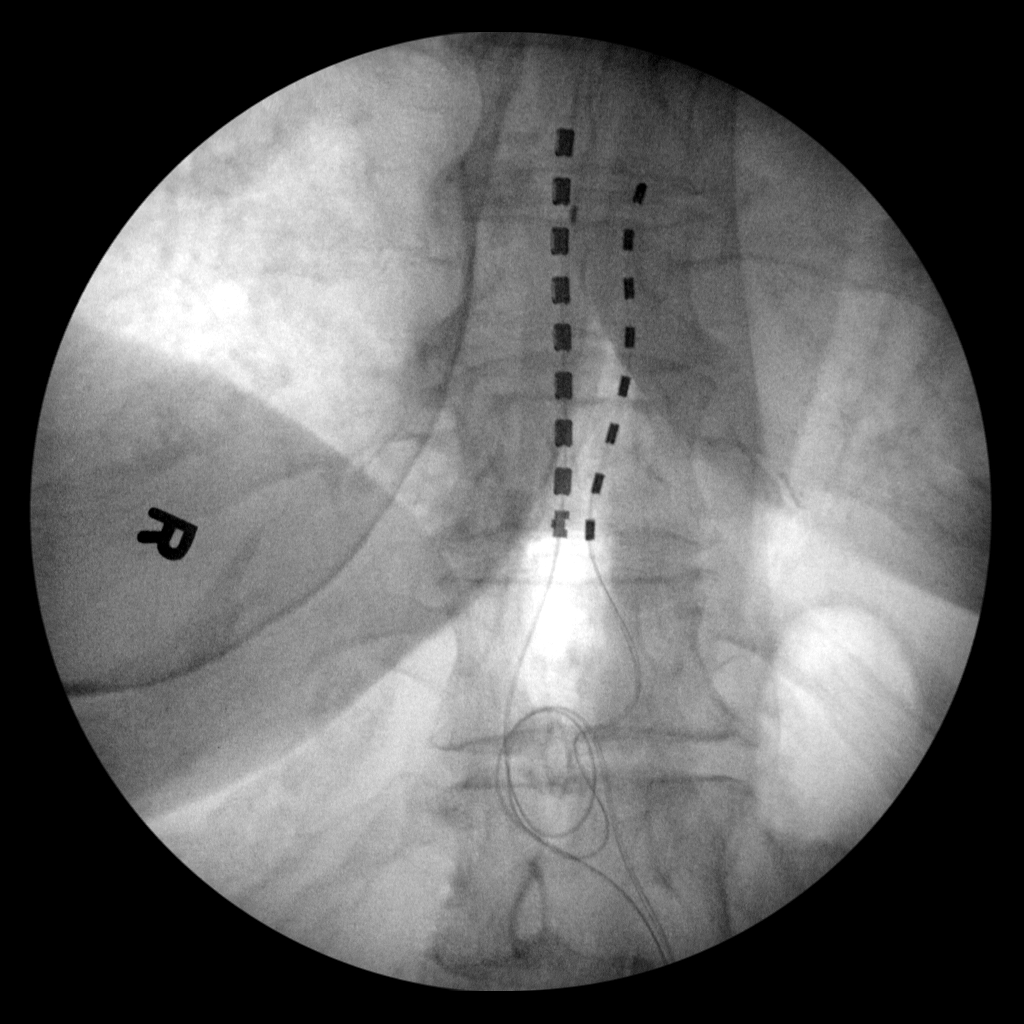
[im 2/2]
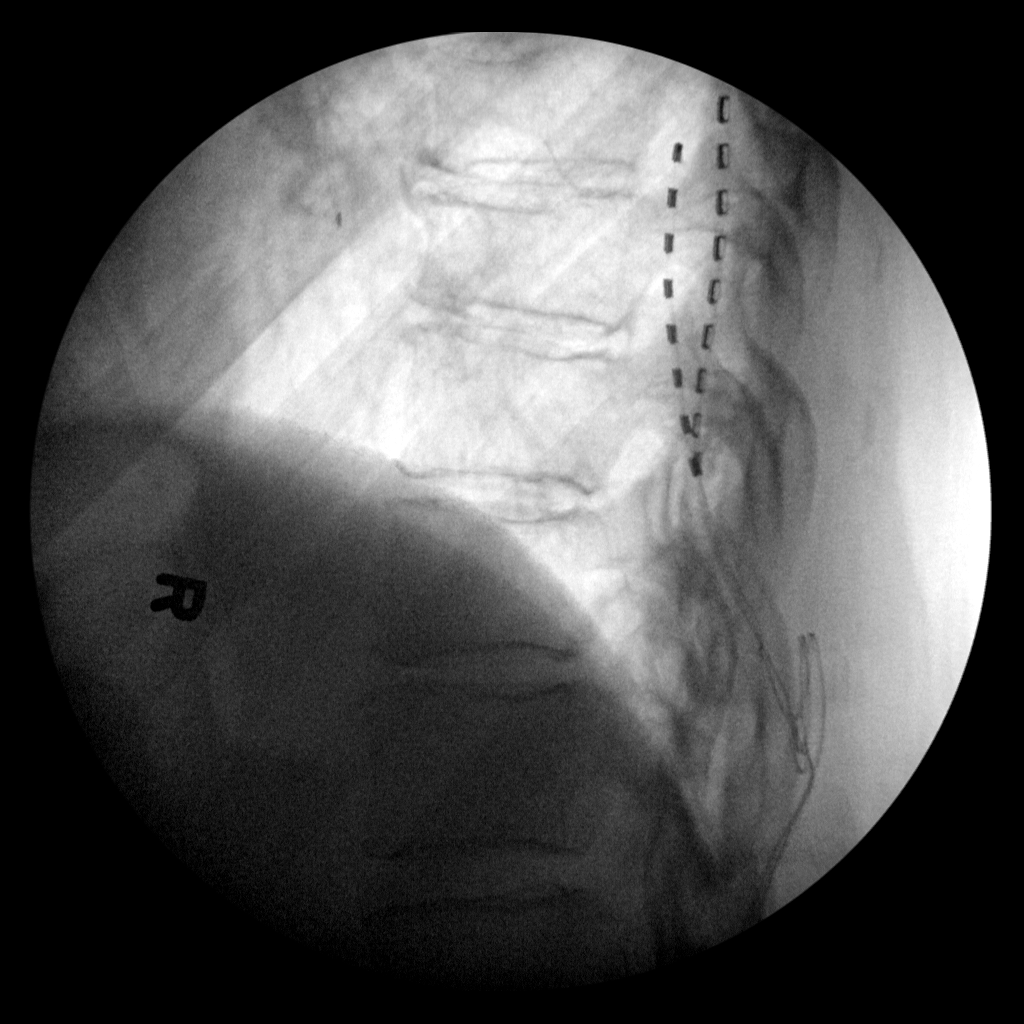

[2 of 2 positions shown; findings below may reference images not displayed]

FINDINGS: Two fluoro spot images reveal placement of 2 neurostimulator
electrodes over the lower thoracic spine. The superior tips of the
electrodes are at approximately T8.
IMPRESSION: The patient has undergone placement of a neurostimulator electrodes
extending from approximately T8-T10 without evidence of immediate
postprocedure complication.

## 2020-04-19 ENCOUNTER — Ambulatory Visit: Payer: Self-pay | Admitting: Student

## 2020-04-29 ENCOUNTER — Ambulatory Visit: Payer: Self-pay | Admitting: Student
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
## 2020-05-10 NOTE — Patient Instructions (Addendum)
DUE TO COVID-19 ONLY ONE VISITOR IS ALLOWED TO COME WITH YOU AND STAY IN THE WAITING ROOM ONLY DURING PRE OP AND PROCEDURE DAY OF SURGERY. THE 1 VISITOR  MAY VISIT WITH YOU AFTER SURGERY IN YOUR PRIVATE ROOM DURING VISITING HOURS ONLY!  YOU NEED TO HAVE A COVID 19 TEST ON: 05/13/20 @ 2:00 PM, THIS TEST MUST BE DONE BEFORE SURGERY,  COVID TESTING SITE [place] [address], IT IS ON THE RIGHT GOING OUT [address] APPROXIMATELY  2 MINUTES PAST [employer] ON THE RIGHT. ONCE YOUR COVID TEST IS COMPLETED,  PLEASE BEGIN THE QUARANTINE INSTRUCTIONS AS OUTLINED IN YOUR HANDOUT.                Traci Garrison    Your procedure is scheduled on: 05/16/20   Report to [facility] Main  Entrance   Report to admitting at: 2:00 PM     Call this number if you have problems the morning of surgery [phone]    Remember:  NO SOLID FOOD AFTER MIDNIGHT THE NIGHT PRIOR TO SURGERY. NOTHING BY MOUTH EXCEPT CLEAR LIQUIDS UNTIL: 1:30 PM . PLEASE FINISH ENSURE DRINK PER SURGEON ORDER  WHICH NEEDS TO BE COMPLETED AT : 1:30 PM.  CLEAR LIQUID DIET  Foods Allowed                                                                     Foods Excluded  Coffee and tea, regular and decaf                             liquids that you cannot  Plain Jell-O any favor except red or purple                                           see through such as: Fruit ices (not with fruit pulp)                                     milk, soups, orange juice  Iced Popsicles                                    All solid food Carbonated beverages, regular and diet                                    Cranberry, grape and apple juices Sports drinks like Gatorade Lightly seasoned clear broth or consume(fat free) Sugar, honey syrup  Sample Menu Breakfast                                Lunch  Supper Cranberry juice                    Beef broth                             Chicken broth Jell-O                                     Grape juice                           Apple juice Coffee or tea                        Jell-O                                      Popsicle                                                Coffee or tea                        Coffee or tea  _____________________________________________________________________   BRUSH YOUR TEETH MORNING OF SURGERY AND RINSE YOUR MOUTH OUT, NO CHEWING GUM CANDY OR MINTS.    Take these medicines the morning of surgery with A SIP OF WATER: bupropion.escitalopram,gabapentin,plaquenil,prednisone.Xanax as needed.                               You may not have any metal on your body including hair pins and              piercings  Do not wear jewelry, make-up, lotions, powders or perfumes, deodorant             Do not wear nail polish on your fingernails.  Do not shave  48 hours prior to surgery.      Do not bring valuables to the hospital. [place].  Contacts, dentures or bridgework may not be worn into surgery.  Leave suitcase in the car. After surgery it may be brought to your room.     Patients discharged the day of surgery will not be allowed to drive home. IF YOU ARE HAVING SURGERY AND GOING HOME THE SAME DAY, YOU MUST HAVE AN ADULT TO DRIVE YOU HOME AND BE WITH YOU FOR 24 HOURS. YOU MAY GO HOME BY TAXI OR UBER OR ORTHERWISE, BUT AN ADULT MUST ACCOMPANY YOU HOME AND STAY WITH YOU FOR 24 HOURS.  Name and phone number of your driver:  Special Instructions: N/A              Please read over the following fact sheets you were given: _____________________________________________________________________         [facility] - Preparing for Surgery Before surgery, you can play an important role.  Because skin is not sterile, your skin needs to be as free of germs as  possible.  You can reduce the number of germs on your skin by washing with CHG (chlorahexidine  gluconate) soap before surgery.  CHG is an antiseptic cleaner which kills germs and bonds with the skin to continue killing germs even after washing. Please DO NOT use if you have an allergy to CHG or antibacterial soaps.  If your skin becomes reddened/irritated stop using the CHG and inform your nurse when you arrive at Short Stay. Do not shave (including legs and underarms) for at least 48 hours prior to the first CHG shower.  You may shave your face/neck. Please follow these instructions carefully:  1.  Shower with CHG Soap the night before surgery and the  morning of Surgery.  2.  If you choose to wash your hair, wash your hair first as usual with your  normal  shampoo.  3.  After you shampoo, rinse your hair and body thoroughly to remove the  shampoo.                           4.  Use CHG as you would any other liquid soap.  You can apply chg directly  to the skin and wash                       Gently with a scrungie or clean washcloth.  5.  Apply the CHG Soap to your body ONLY FROM THE NECK DOWN.   Do not use on face/ open                           Wound or open sores. Avoid contact with eyes, ears mouth and genitals (private parts).                       Wash face,  Genitals (private parts) with your normal soap.             6.  Wash thoroughly, paying special attention to the area where your surgery  will be performed.  7.  Thoroughly rinse your body with warm water from the neck down.  8.  DO NOT shower/wash with your normal soap after using and rinsing off  the CHG Soap.                9.  Pat yourself dry with a clean towel.            10.  Wear clean pajamas.            11.  Place clean sheets on your bed the night of your first shower and do not  sleep with pets. Day of Surgery : Do not apply any lotions/deodorants the morning of surgery.  Please wear clean clothes to the hospital/surgery center.  FAILURE TO FOLLOW THESE INSTRUCTIONS MAY RESULT IN THE CANCELLATION OF YOUR  SURGERY PATIENT SIGNATURE_________________________________  NURSE SIGNATURE__________________________________  ________________________________________________________________________   Traci Garrison  An incentive spirometer is a tool that can help keep your lungs clear and active. This tool measures how well you are filling your lungs with each breath. Taking long deep breaths may help reverse or decrease the chance of developing breathing (pulmonary) problems (especially infection) following:  A long period of time when you are unable to move or be active. BEFORE THE PROCEDURE   If the spirometer includes an indicator to show your best effort, your nurse or respiratory  therapist will set it to a desired goal.  If possible, sit up straight or lean slightly forward. Try not to slouch.  Hold the incentive spirometer in an upright position. INSTRUCTIONS FOR USE  1. Sit on the edge of your bed if possible, or sit up as far as you can in bed or on a chair. 2. Hold the incentive spirometer in an upright position. 3. Breathe out normally. 4. Place the mouthpiece in your mouth and seal your lips tightly around it. 5. Breathe in slowly and as deeply as possible, raising the piston or the ball toward the top of the column. 6. Hold your breath for 3-5 seconds or for as long as possible. Allow the piston or ball to fall to the bottom of the column. 7. Remove the mouthpiece from your mouth and breathe out normally. 8. Rest for a few seconds and repeat Steps 1 through 7 at least 10 times every 1-2 hours when you are awake. Take your time and take a few normal breaths between deep breaths. 9. The spirometer may include an indicator to show your best effort. Use the indicator as a goal to work toward during each repetition. 10. After each set of 10 deep breaths, practice coughing to be sure your lungs are clear. If you have an incision (the cut made at the time of surgery), support your  incision when coughing by placing a pillow or rolled up towels firmly against it. Once you are able to get out of bed, walk around indoors and cough well. You may stop using the incentive spirometer when instructed by your caregiver.  RISKS AND COMPLICATIONS  Take your time so you do not get dizzy or light-headed.  If you are in pain, you may need to take or ask for pain medication before doing incentive spirometry. It is harder to take a deep breath if you are having pain. AFTER USE  Rest and breathe slowly and easily.  It can be helpful to keep track of a log of your progress. Your caregiver can provide you with a simple table to help with this. If you are using the spirometer at home, follow these instructions: [place] IF:   You are having difficultly using the spirometer.  You have trouble using the spirometer as often as instructed.  Your pain medication is not giving enough relief while using the spirometer.  You develop fever of 100.5 F (38.1 C) or higher. SEEK IMMEDIATE MEDICAL CARE IF:   You cough up bloody sputum that had not been present before.  You develop fever of 102 F (38.9 C) or greater.  You develop worsening pain at or near the incision site. MAKE SURE YOU:   Understand these instructions.  Will watch your condition.  Will get help right away if you are not doing well or get worse. Document Released: 07/27/2006 Document Revised: 06/08/2011 Document Reviewed: 09/27/2006 [facility] Patient Information 2014 [place], [place].   ________________________________________________________________________

## 2020-05-13 ENCOUNTER — Encounter (HOSPITAL_COMMUNITY): Payer: Self-pay

## 2020-05-13 ENCOUNTER — Encounter (HOSPITAL_COMMUNITY)
Admission: RE | Admit: 2020-05-13 | Discharge: 2020-05-13 | Disposition: A | Discharge status: Home / Self Care | Payer: Medicare Other | Source: Ambulatory Visit | Attending: Orthopedic Surgery | Admitting: Orthopedic Surgery

## 2020-05-13 ENCOUNTER — Other Ambulatory Visit: Payer: Self-pay

## 2020-05-13 DIAGNOSIS — Z01818 Encounter for other preprocedural examination: Secondary | ICD-10-CM | POA: Insufficient documentation

## 2020-05-13 HISTORY — DX: Malignant (primary) neoplasm, unspecified: C80.1

## 2020-05-13 HISTORY — DX: Acute myocardial infarction, unspecified: I21.9

## 2020-05-13 LAB — CBC
HCT: 44 % (ref 36.0–46.0)
Hemoglobin: 13.8 g/dL (ref 12.0–15.0)
MCH: 31.1 pg (ref 26.0–34.0)
MCHC: 31.4 g/dL (ref 30.0–36.0)
MCV: 99.1 fL (ref 80.0–100.0)
Platelets: 257 K/uL (ref 150–400)
RBC: 4.44 MIL/uL (ref 3.87–5.11)
RDW: 13.4 % (ref 11.5–15.5)
WBC: 10.2 K/uL (ref 4.0–10.5)
nRBC: 0 % (ref 0.0–0.2)

## 2020-05-13 LAB — URINALYSIS, ROUTINE W REFLEX MICROSCOPIC
Bilirubin Urine: NEGATIVE
Glucose, UA: NEGATIVE mg/dL
Ketones, ur: NEGATIVE mg/dL
Nitrite: NEGATIVE
Protein, ur: NEGATIVE mg/dL
Specific Gravity, Urine: 1.023 (ref 1.005–1.030)
pH: 5 (ref 5.0–8.0)

## 2020-05-13 LAB — COMPREHENSIVE METABOLIC PANEL
ALT: 16 U/L (ref 0–44)
AST: 29 U/L (ref 15–41)
Albumin: 4.4 g/dL (ref 3.5–5.0)
Alkaline Phosphatase: 49 U/L (ref 38–126)
Anion gap: 10 (ref 5–15)
BUN: 19 mg/dL (ref 8–23)
CO2: 26 mmol/L (ref 22–32)
Calcium: 9.2 mg/dL (ref 8.9–10.3)
Chloride: 104 mmol/L (ref 98–111)
Creatinine, Ser: 0.93 mg/dL (ref 0.44–1.00)
GFR, Estimated: >60 mL/min (ref >60)
Glucose, Bld: 93 mg/dL (ref 70–99)
Potassium: 4.4 mmol/L (ref 3.5–5.1)
Sodium: 140 mmol/L (ref 135–145)
Total Bilirubin: 1.1 mg/dL (ref 0.3–1.2)
Total Protein: 7.5 g/dL (ref 6.5–8.1)

## 2020-05-13 LAB — SURGICAL PCR SCREEN
MRSA, PCR: NEGATIVE
Staphylococcus aureus: NEGATIVE

## 2020-05-13 LAB — PROTIME-INR
INR: 0.9 (ref 0.8–1.2)
Prothrombin Time: 12.1 s (ref 11.4–15.2)

## 2020-05-13 NOTE — Progress Notes (Signed)
COVID Vaccine Completed: Yes Date COVID Vaccine completed: 02/21/20 [place] COVID vaccine manufacturer:   Moderna    PCP - Dr. Arlyss Repress.Clearance: 04/30/20: Chart. Cardiologist -   Chest x-ray -  EKG -  Stress Test -  ECHO -  Cardiac Cath -  Pacemaker/ICD device last checked:  Sleep Study -  CPAP -   Fasting Blood Sugar -  Checks Blood Sugar _____ times a day  Blood Thinner Instructions: Aspirin Instructions: Last Dose:  Anesthesia review: Hx: MI  Patient denies shortness of breath, fever, cough and chest pain at PAT appointment   Patient verbalized understanding of instructions that were given to them at the PAT appointment. Patient was also instructed that they will need to review over the PAT instructions again at home before surgery.

## 2020-05-14 ENCOUNTER — Encounter (HOSPITAL_COMMUNITY): Payer: Self-pay | Admitting: Physician Assistant

## 2020-05-14 ENCOUNTER — Other Ambulatory Visit (HOSPITAL_COMMUNITY)
Admission: RE | Admit: 2020-05-14 | Discharge: 2020-05-14 | Disposition: A | Discharge status: Home / Self Care | Payer: Medicare Other | Source: Ambulatory Visit | Attending: Orthopedic Surgery | Admitting: Orthopedic Surgery

## 2020-05-14 ENCOUNTER — Encounter (HOSPITAL_COMMUNITY): Payer: Self-pay | Admitting: Anesthesiology

## 2020-05-14 DIAGNOSIS — Z20822 Contact with and (suspected) exposure to covid-19: Secondary | ICD-10-CM | POA: Insufficient documentation

## 2020-05-14 DIAGNOSIS — Z01812 Encounter for preprocedural laboratory examination: Secondary | ICD-10-CM | POA: Diagnosis present

## 2020-05-14 LAB — SARS CORONAVIRUS 2 (TAT 6-24 HRS): SARS Coronavirus 2: NEGATIVE

## 2020-05-14 NOTE — Progress Notes (Signed)
Lab. Results: Urinalysis: Leukocytes: large.
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
## 2020-05-16 ENCOUNTER — Encounter (HOSPITAL_COMMUNITY)
Admission: RE | Disposition: A | Discharge status: Home / Self Care | Payer: Self-pay | Source: Home / Self Care | Attending: Orthopedic Surgery

## 2020-05-16 ENCOUNTER — Encounter (HOSPITAL_COMMUNITY): Payer: Self-pay | Admitting: Orthopedic Surgery

## 2020-05-16 ENCOUNTER — Ambulatory Visit (HOSPITAL_COMMUNITY)
Admission: RE | Admit: 2020-05-16 | Discharge: 2020-05-16 | Disposition: A | Discharge status: Home / Self Care | Payer: Medicare Other | Attending: Orthopedic Surgery | Admitting: Orthopedic Surgery

## 2020-05-16 DIAGNOSIS — Z538 Procedure and treatment not carried out for other reasons: Secondary | ICD-10-CM | POA: Insufficient documentation

## 2020-05-16 DIAGNOSIS — M1711 Unilateral primary osteoarthritis, right knee: Secondary | ICD-10-CM | POA: Insufficient documentation

## 2020-05-16 LAB — TYPE AND SCREEN
ABO/RH(D): A POS
Antibody Screen: NEGATIVE

## 2020-05-16 SURGERY — CANCELLED PROCEDURE

## 2020-05-16 MED ORDER — TRANEXAMIC ACID-NACL 1000-0.7 MG/100ML-% IV SOLN: 1000.0000 mg | INTRAVENOUS | Status: DC

## 2020-05-16 MED ORDER — PROPOFOL 10 MG/ML IV BOLUS
INTRAVENOUS | Status: AC
  Filled 2020-05-16: qty 20

## 2020-05-16 MED ORDER — DEXAMETHASONE SODIUM PHOSPHATE 10 MG/ML IJ SOLN
INTRAMUSCULAR | Status: AC
  Filled 2020-05-16: qty 1

## 2020-05-16 MED ORDER — FENTANYL CITRATE (PF) 100 MCG/2ML IJ SOLN
INTRAMUSCULAR | Status: AC
  Filled 2020-05-16: qty 2

## 2020-05-16 MED ORDER — CEFAZOLIN SODIUM-DEXTROSE 2-4 GM/100ML-% IV SOLN: 2.0000 g | INTRAVENOUS | Status: DC

## 2020-05-16 MED ORDER — SODIUM CHLORIDE 0.9 % IV SOLN: INTRAVENOUS | Status: DC

## 2020-05-16 MED ORDER — POVIDONE-IODINE 10 % EX SWAB: 2.0000 "application " | Freq: Once | CUTANEOUS | Status: DC

## 2020-05-16 MED ORDER — MIDAZOLAM HCL 2 MG/2ML IJ SOLN: 1.0000 mg | INTRAMUSCULAR | Status: DC

## 2020-05-16 MED ORDER — MIDAZOLAM HCL 2 MG/2ML IJ SOLN
INTRAMUSCULAR | Status: AC
  Filled 2020-05-16: qty 2

## 2020-05-16 MED ORDER — CEFAZOLIN SODIUM-DEXTROSE 2-4 GM/100ML-% IV SOLN
INTRAVENOUS | Status: AC
  Filled 2020-05-16: qty 100

## 2020-05-16 MED ORDER — ACETAMINOPHEN 10 MG/ML IV SOLN: 1000.0000 mg | Freq: Once | INTRAVENOUS | Status: DC

## 2020-05-16 MED ORDER — ORAL CARE MOUTH RINSE: 15.0000 mL | Freq: Once | OROMUCOSAL | Status: DC

## 2020-05-16 MED ORDER — LACTATED RINGERS IV SOLN: INTRAVENOUS | Status: DC

## 2020-05-16 MED ORDER — FENTANYL CITRATE (PF) 100 MCG/2ML IJ SOLN: 50.0000 ug | INTRAMUSCULAR | Status: DC

## 2020-05-16 MED ORDER — ONDANSETRON HCL 4 MG/2ML IJ SOLN
INTRAMUSCULAR | Status: AC
  Filled 2020-05-16: qty 2

## 2020-05-16 MED ORDER — TRANEXAMIC ACID-NACL 1000-0.7 MG/100ML-% IV SOLN
INTRAVENOUS | Status: AC
  Filled 2020-05-16: qty 100

## 2020-05-16 MED ORDER — CHLORHEXIDINE GLUCONATE 0.12 % MT SOLN: 15.0000 mL | Freq: Once | OROMUCOSAL | Status: DC

## 2020-05-16 MED ORDER — ACETAMINOPHEN 10 MG/ML IV SOLN
INTRAVENOUS | Status: AC
  Filled 2020-05-16: qty 100

## 2020-05-16 MED ORDER — FENTANYL CITRATE (PF) 250 MCG/5ML IJ SOLN
INTRAMUSCULAR | Status: AC
  Filled 2020-05-16: qty 5

## 2020-05-16 SURGICAL SUPPLY — 68 items
ADH SKN CLS APL DERMABOND .7 (GAUZE/BANDAGES/DRESSINGS)
APL PRP STRL LF DISP 70% ISPRP (MISCELLANEOUS)
BAG SPEC THK2 15X12 ZIP CLS (MISCELLANEOUS)
BAG ZIPLOCK 12X15 (MISCELLANEOUS) IMPLANT
BATTERY INSTRU NAVIGATION (MISCELLANEOUS) ×3 IMPLANT
BLADE SAW RECIPROCATING 77.5 (BLADE) ×1 IMPLANT
BNDG ELASTIC 4X5.8 VLCR STR LF (GAUZE/BANDAGES/DRESSINGS) ×1 IMPLANT
BNDG ELASTIC 6X5.8 VLCR STR LF (GAUZE/BANDAGES/DRESSINGS) ×1 IMPLANT
BTRY SRG DRVR LF (MISCELLANEOUS)
CHLORAPREP W/TINT 26 (MISCELLANEOUS) ×2 IMPLANT
COVER SURGICAL LIGHT HANDLE (MISCELLANEOUS) ×1 IMPLANT
COVER WAND RF STERILE (DRAPES) IMPLANT
CUFF TOURN SGL QUICK 34 (TOURNIQUET CUFF)
CUFF TRNQT CYL 34X4.125X (TOURNIQUET CUFF) ×1 IMPLANT
DECANTER SPIKE VIAL GLASS SM (MISCELLANEOUS) ×2 IMPLANT
DERMABOND ADVANCED (GAUZE/BANDAGES/DRESSINGS)
DERMABOND ADVANCED .7 DNX12 (GAUZE/BANDAGES/DRESSINGS) ×2 IMPLANT
DRAPE SHEET LG 3/4 BI-LAMINATE (DRAPES) ×3 IMPLANT
DRAPE U-SHAPE 47X51 STRL (DRAPES) IMPLANT
DRSG AQUACEL AG ADV 3.5X10 (GAUZE/BANDAGES/DRESSINGS) IMPLANT
DRSG TEGADERM 4X4.75 (GAUZE/BANDAGES/DRESSINGS) IMPLANT
ELECT BLADE TIP CTD 4 INCH (ELECTRODE) IMPLANT
ELECT REM PT RETURN 15FT ADLT (MISCELLANEOUS) ×1 IMPLANT
EVACUATOR 1/8 PVC DRAIN (DRAIN) IMPLANT
GAUZE SPONGE 4X4 12PLY STRL (GAUZE/BANDAGES/DRESSINGS) ×1 IMPLANT
GLOVE BIO SURGEON STRL SZ8.5 (GLOVE) ×2 IMPLANT
GLOVE SRG 8 PF TXTR STRL LF DI (GLOVE) ×2 IMPLANT
GLOVE SURG ENC TEXT LTX SZ7.5 (GLOVE) ×3 IMPLANT
GLOVE SURG UNDER POLY LF SZ8 (GLOVE)
GLOVE SURG UNDER POLY LF SZ8.5 (GLOVE) ×1 IMPLANT
GOWN SPEC L3 XXLG W/TWL (GOWN DISPOSABLE) ×1 IMPLANT
GOWN SPEC L4 XLG W/TWL (GOWN DISPOSABLE) ×1 IMPLANT
HANDPIECE INTERPULSE COAX TIP (DISPOSABLE)
HOLDER FOLEY CATH W/STRAP (MISCELLANEOUS) IMPLANT
HOOD PEEL AWAY FLYTE STAYCOOL (MISCELLANEOUS) ×3 IMPLANT
JET LAVAGE IRRISEPT WOUND (IRRIGATION / IRRIGATOR)
KIT TURNOVER KIT A (KITS) ×1 IMPLANT
LAVAGE JET IRRISEPT WOUND (IRRIGATION / IRRIGATOR) ×1 IMPLANT
MARKER SKIN DUAL TIP RULER LAB (MISCELLANEOUS) ×1 IMPLANT
NDL SAFETY ECLIPSE 18X1.5 (NEEDLE) ×1 IMPLANT
NDL SPNL 18GX3.5 QUINCKE PK (NEEDLE) ×1 IMPLANT
NEEDLE HYPO 18GX1.5 SHARP (NEEDLE)
NEEDLE SPNL 18GX3.5 QUINCKE PK (NEEDLE) IMPLANT
NS IRRIG 1000ML POUR BTL (IV SOLUTION) IMPLANT
PACK TOTAL KNEE CUSTOM (KITS) IMPLANT
PADDING CAST COTTON 6X4 STRL (CAST SUPPLIES) ×1 IMPLANT
PENCIL SMOKE EVACUATOR (MISCELLANEOUS) IMPLANT
PROTECTOR NERVE ULNAR (MISCELLANEOUS) ×1 IMPLANT
SAW OSC TIP CART 19.5X105X1.3 (SAW) ×1 IMPLANT
SEALER BIPOLAR AQUA 6.0 (INSTRUMENTS) ×1 IMPLANT
SET HNDPC FAN SPRY TIP SCT (DISPOSABLE) ×1 IMPLANT
SET PAD KNEE POSITIONER (MISCELLANEOUS) ×1 IMPLANT
SPONGE DRAIN TRACH 4X4 STRL 2S (GAUZE/BANDAGES/DRESSINGS) IMPLANT
SUT MNCRL AB 3-0 PS2 18 (SUTURE) IMPLANT
SUT MNCRL AB 4-0 PS2 18 (SUTURE) ×1 IMPLANT
SUT MON AB 2-0 CT1 36 (SUTURE) ×1 IMPLANT
SUT STRATAFIX PDO 1 14 VIOLET (SUTURE)
SUT STRATFX PDO 1 14 VIOLET (SUTURE)
SUT VIC AB 1 CTX 36 (SUTURE)
SUT VIC AB 1 CTX36XBRD ANBCTR (SUTURE) IMPLANT
SUT VIC AB 2-0 CT1 27 (SUTURE)
SUT VIC AB 2-0 CT1 TAPERPNT 27 (SUTURE) ×1 IMPLANT
SUTURE STRATFX PDO 1 14 VIOLET (SUTURE) ×1 IMPLANT
SYR 3ML LL SCALE MARK (SYRINGE) ×1 IMPLANT
TOWER CARTRIDGE SMART MIX (DISPOSABLE) IMPLANT
TRAY FOLEY MTR SLVR 16FR STAT (SET/KITS/TRAYS/PACK) IMPLANT
TUBE SUCTION HIGH CAP CLEAR NV (SUCTIONS) ×1 IMPLANT
WATER STERILE IRR 1000ML POUR (IV SOLUTION) ×2 IMPLANT
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
## 2020-05-16 NOTE — Anesthesia Preprocedure Evaluation (Addendum)
Anesthesia Evaluation  Patient identified by MRN, date of birth, ID band Patient awake    Reviewed: Allergy & Precautions, H&P , NPO status , Patient's Chart, lab work & pertinent test results, reviewed documented beta blocker date and time   Airway Mallampati: II  TM Distance: >3 FB Neck ROM: full    Dental no notable dental hx.    Pulmonary neg pulmonary ROS, former smoker,    Pulmonary exam normal breath sounds clear to auscultation       Cardiovascular Exercise Tolerance: Good + Past MI and + Cardiac Stents   Rhythm:regular Rate:Normal     Neuro/Psych  Headaches, Anxiety negative psych ROS   GI/Hepatic GERD  ,(+) Hepatitis -  Endo/Other  negative endocrine ROS  Renal/GU negative Renal ROS  negative genitourinary   Musculoskeletal  (+) Arthritis , Osteoarthritis and Rheumatoid disorders,    Abdominal   Peds  Hematology negative hematology ROS (+)   Anesthesia Other Findings   Reproductive/Obstetrics negative OB ROS                            Anesthesia Physical Anesthesia Plan  ASA: III  Anesthesia Plan: Spinal and MAC   Post-op Pain Management:  Regional for Post-op pain   Induction:   PONV Risk Score and Plan: 2 and Ondansetron  Airway Management Planned:   Additional Equipment: None  Intra-op Plan:   Post-operative Plan:   Informed Consent: I have reviewed the patients History and Physical, chart, labs and discussed the procedure including the risks, benefits and alternatives for the proposed anesthesia with the patient or authorized representative who has indicated his/her understanding and acceptance.     Dental Advisory Given  Plan Discussed with: CRNA and Anesthesiologist  Anesthesia Plan Comments: (80 y.o. former smoker with h/o GERD, CAD (stent 2017), RA, right knee djd scheduled for above procedure 05/16/2020 with Dr. Rod Can.   Pt last seen by  internal medicine 04/30/20 for preoperative evaluation. Per OV note, "Coronary disease has been asymptomatic since stent placement and MI in 2017. She has osteoarthritis and has been diagnosed with seronegative rheumatoid arthritis as well. Labs were recently checked by her rheumatologist and looked okay. 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 We will add Wellbutrin XL 150 mg daily."    EKG: 05/13/2020 Rate 81 bpm  Normal sinus rhythm with sinus arrhythmia Normal ECG No significant change since last tracing  CV: Echo 06/26/2016 SUMMARY  The left ventricular size is normal.  There is normal left ventricularwall thickness.   Left ventricular systolic function is normal.  LV ejection fraction = 55-60%.   Left ventricular filling pattern is pseudonormal.  The right ventricle is normal in size and function.  There is no atrial enlargement.  There is mild mitral and tricuspid regurgitation.  No pulmonary hypertension.  IVC size was normal.  There is no pericardial effusion.  Compared to prior echo 10/20/15, there is no significant change.   Stress Test 06/26/2016  The patient had no chest pain.  The patient achieved 88 % of maximum predicted heart rate.  The METS achieved was 8.  Exercise capacity was average.  Negative stress ECG for inducible ischemia at target heart rate.  The estimated LV ejection fraction is 60-65% .  There were no segmental wall motion abnormalities at rest  The estimated LV ejection fraction is 65-70% with stress.    CASE CANCELED PER SURGEON,  PT ON STEROIDS  WITH FRIABLE SKIN.    )      Anesthesia Quick Evaluation

## 2020-06-04 ENCOUNTER — Ambulatory Visit: Payer: Self-pay | Admitting: Student
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
## 2020-06-04 NOTE — Progress Notes (Signed)
Sent message, via epic in basket, requesting orders in epic from surgeon.  

## 2020-06-06 NOTE — Progress Notes (Signed)
Left voicemail with Caryl Pina at Dr. Sid Falcon office requesting orders in epic.

## 2020-06-13 ENCOUNTER — Encounter (HOSPITAL_COMMUNITY): Admission: RE | Admit: 2020-06-13 | Payer: Medicare Other | Source: Ambulatory Visit

## 2020-06-20 ENCOUNTER — Inpatient Hospital Stay: Admit: 2020-06-20 | Payer: PRIVATE HEALTH INSURANCE | Admitting: Orthopedic Surgery

## 2020-06-20 SURGERY — ARTHROPLASTY, KNEE, TOTAL, USING IMAGELESS COMPUTER-ASSISTED NAVIGATION
Anesthesia: Spinal | Site: Knee | Laterality: Right

## 2020-08-19 ENCOUNTER — Ambulatory Visit: Payer: Self-pay | Admitting: Student

## 2020-08-20 NOTE — Patient Instructions (Addendum)
DUE TO COVID-19 ONLY ONE VISITOR IS ALLOWED TO COME WITH YOU AND STAY IN THE WAITING ROOM ONLY DURING PRE OP AND PROCEDURE.   **NO VISITORS ARE ALLOWED IN THE SHORT STAY AREA OR RECOVERY ROOM!!**  IF YOU WILL BE ADMITTED INTO THE HOSPITAL YOU ARE ALLOWED ONLY TWO SUPPORT PEOPLE DURING VISITATION HOURS ONLY (10AM -8PM)   . The support person(s) may change daily. . The support person(s) must pass our screening, gel in and out, and wear a mask at all times, including in the patient's room. . Patients must also wear a mask when staff or their support person are in the room.  No visitors under the age of 44. Any visitor under the age of 86 must be accompanied by an adult.    COVID SWAB TESTING MUST BE COMPLETED ON:  09-09-20 @ 1:50 PM   [address] [place], [place] [address]    . You are not required to quarantine, however you are required to wear a well-fitted mask when you are out and around people not in your household.  [facility] [place] Hygiene often . Do NOT share personal items . Notify your provider if you are in close contact with someone who has COVID or you develop fever 100.4 or greater, new onset of sneezing, cough, sore throat, shortness of breath or body aches.        Your procedure is scheduled on:  Wednesday, 09-11-20   Report to [facility] Main  Entrance   Report to admitting at 9:20 AM   Call this number if you have problems the morning of surgery [phone]   Do not eat food :After Midnight.   May have liquids until  8:50 AM day of surgery  CLEAR LIQUID DIET  Foods Allowed                                                                     Foods Excluded  Water, Black Coffee and tea, regular and decaf            liquids that you cannot  Plain Jell-O in any flavor  (No red)                                   see through such as: Fruit ices (not with fruit pulp)                                      milk, soups, orange juice              Iced Popsicles (No red)                                       All solid food                                   Apple juices Sports drinks like Gatorade (No red) Lightly seasoned clear broth or consume(fat free)  Sugar, honey syrup     Complete one Ensure drink the morning of surgery at 8:50 AM the day of surgery.       1. The day of surgery:  ? Drink ONE (1) Pre-Surgery Clear Ensure or G2 by am the morning of surgery. Drink in one sitting. Do not sip.  ? This drink was given to you during your hospital  pre-op appointment visit. ? Nothing else to drink after completing the  Pre-Surgery Clear Ensure or G2.          If you have questions, please contact your surgeon's office.     Oral Hygiene is also important to reduce your risk of infection.                                    Remember - BRUSH YOUR TEETH THE MORNING OF SURGERY WITH YOUR REGULAR TOOTHPASTE   Do NOT smoke after Midnight   Take these medicines the morning of surgery with A SIP OF WATER: Alprazolam, Bupropion, Escitalopram, Gabapentin, Oxycodone,  Prednisone                                You may not have any metal on your body including hair pins, jewelry, and body piercings             Do not wear make-up, lotions, powders, perfumes/cologne, or deodorant             Do not wear nail polish.  Do not shave  48 hours prior to surgery.               Do not bring valuables to the hospital. [place].   Contacts, dentures or bridgework may not be worn into surgery.   Bring small overnight bag day of surgery.               Please read over the following fact sheets you were given: IF YOU HAVE QUESTIONS ABOUT YOUR PRE OP INSTRUCTIONS PLEASE CALL  [facility] - Preparing for Surgery Before surgery, you can play an important role.  Because skin is not sterile, your skin needs to be as free of germs as possible.  You can reduce the number of germs on your skin by washing with CHG  (chlorahexidine gluconate) soap before surgery.  CHG is an antiseptic cleaner which kills germs and bonds with the skin to continue killing germs even after washing. Please DO NOT use if you have an allergy to CHG or antibacterial soaps.  If your skin becomes reddened/irritated stop using the CHG and inform your nurse when you arrive at Short Stay. Do not shave (including legs and underarms) for at least 48 hours prior to the first CHG shower.  You may shave your face/neck.  Please follow these instructions carefully:  1.  Shower with CHG Soap the night before surgery and the  morning of surgery.  2.  If you choose to wash your hair, wash your hair first as usual with your normal  shampoo.  3.  After you shampoo, rinse your hair and body thoroughly to remove the shampoo.                             4.  Use CHG as you would any other liquid soap.  You can apply chg directly to the skin and wash.  Gently with a scrungie or clean washcloth.  5.  Apply the CHG Soap to your body ONLY FROM THE NECK DOWN.   Do   not use on face/ open                           Wound or open sores. Avoid contact with eyes, ears mouth and   genitals (private parts).                       Wash face,  Genitals (private parts) with your normal soap.             6.  Wash thoroughly, paying special attention to the area where your    surgery  will be performed.  7.  Thoroughly rinse your body with warm water from the neck down.  8.  DO NOT shower/wash with your normal soap after using and rinsing off the CHG Soap.                9.  Pat yourself dry with a clean towel.            10.  Wear clean pajamas.            11.  Place clean sheets on your bed the night of your first shower and do not  sleep with pets. Day of Surgery : Do not apply any lotions/deodorants the morning of surgery.  Please wear clean clothes to the hospital/surgery center.  FAILURE TO FOLLOW THESE INSTRUCTIONS MAY RESULT IN THE CANCELLATION OF YOUR  SURGERY  PATIENT SIGNATURE_________________________________  NURSE SIGNATURE__________________________________  ________________________________________________________________________   Traci Garrison  An incentive spirometer is a tool that can help keep your lungs clear and active. This tool measures how well you are filling your lungs with each breath. Taking long deep breaths may help reverse or decrease the chance of developing breathing (pulmonary) problems (especially infection) following:  A long period of time when you are unable to move or be active. BEFORE THE PROCEDURE   If the spirometer includes an indicator to show your best effort, your nurse or respiratory therapist will set it to a desired goal.  If possible, sit up straight or lean slightly forward. Try not to slouch.  Hold the incentive spirometer in an upright position. INSTRUCTIONS FOR USE  1. Sit on the edge of your bed if possible, or sit up as far as you can in bed or on a chair. 2. Hold the incentive spirometer in an upright position. 3. Breathe out normally. 4. Place the mouthpiece in your mouth and seal your lips tightly around it. 5. Breathe in slowly and as deeply as possible, raising the piston or the ball toward the top of the column. 6. Hold your breath for 3-5 seconds or for as long as possible. Allow the piston or ball to fall to the bottom of the column. 7. Remove the mouthpiece from your mouth and breathe out normally. 8. Rest for a few seconds and repeat Steps 1 through 7 at least 10 times every 1-2 hours when you are awake. Take your time and take a few normal breaths between deep breaths. 9. The spirometer may include an indicator to show your best effort. Use the indicator as a goal to work toward during each repetition. 10. After  each set of 10 deep breaths, practice coughing to be sure your lungs are clear. If you have an incision (the cut made at the time of surgery), support your  incision when coughing by placing a pillow or rolled up towels firmly against it. Once you are able to get out of bed, walk around indoors and cough well. You may stop using the incentive spirometer when instructed by your caregiver.  RISKS AND COMPLICATIONS  Take your time so you do not get dizzy or light-headed.  If you are in pain, you may need to take or ask for pain medication before doing incentive spirometry. It is harder to take a deep breath if you are having pain. AFTER USE  Rest and breathe slowly and easily.  It can be helpful to keep track of a log of your progress. Your caregiver can provide you with a simple table to help with this. If you are using the spirometer at home, follow these instructions: [place] IF:   You are having difficultly using the spirometer.  You have trouble using the spirometer as often as instructed.  Your pain medication is not giving enough relief while using the spirometer.  You develop fever of 100.5 F (38.1 C) or higher. SEEK IMMEDIATE MEDICAL CARE IF:   You cough up bloody sputum that had not been present before.  You develop fever of 102 F (38.9 C) or greater.  You develop worsening pain at or near the incision site. MAKE SURE YOU:   Understand these instructions.  Will watch your condition.  Will get help right away if you are not doing well or get worse. Document Released: 07/27/2006 Document Revised: 06/08/2011 Document Reviewed: 09/27/2006 Exit[employer] Patient Information 2014 Exit[employer], [place].   ________________________________________________________________________  WHAT IS A BLOOD TRANSFUSION? Blood Transfusion Information  A transfusion is the replacement of blood or some of its parts. Blood is made up of multiple cells which provide different functions.  Red blood cells carry oxygen and are used for blood loss replacement.  White blood cells fight against infection.  Platelets control bleeding.  Plasma  helps clot blood.  Other blood products are available for specialized needs, such as hemophilia or other clotting disorders. BEFORE THE TRANSFUSION  Who gives blood for transfusions?   Healthy volunteers who are fully evaluated to make sure their blood is safe. This is blood bank blood. Transfusion therapy is the safest it has ever been in the practice of medicine. Before blood is taken from a donor, a complete history is taken to make sure that person has no history of diseases nor engages in risky social behavior (examples are intravenous drug use or sexual activity with multiple partners). The donor's travel history is screened to minimize risk of transmitting infections, such as malaria. The donated blood is tested for signs of infectious diseases, such as HIV and hepatitis. The blood is then tested to be sure it is compatible with you in order to minimize the chance of a transfusion reaction. If you or a relative donates blood, this is often done in anticipation of surgery and is not appropriate for emergency situations. It takes many days to process the donated blood. RISKS AND COMPLICATIONS Although transfusion therapy is very safe and saves many lives, the main dangers of transfusion include:   Getting an infectious disease.  Developing a transfusion reaction. This is an allergic reaction to something in the blood you were given. Every precaution is taken to prevent this. The decision to  have a blood transfusion has been considered carefully by your caregiver before blood is given. Blood is not given unless the benefits outweigh the risks. AFTER THE TRANSFUSION  Right after receiving a blood transfusion, you will usually feel much better and more energetic. This is especially true if your red blood cells have gotten low (anemic). The transfusion raises the level of the red blood cells which carry oxygen, and this usually causes an energy increase.  The nurse administering the transfusion  will monitor you carefully for complications. HOME CARE INSTRUCTIONS  No special instructions are needed after a transfusion. You may find your energy is better. Speak with your caregiver about any limitations on activity for underlying diseases you may have. SEEK MEDICAL CARE IF:   Your condition is not improving after your transfusion.  You develop redness or irritation at the intravenous (IV) site. SEEK IMMEDIATE MEDICAL CARE IF:  Any of the following symptoms occur over the next 12 hours:  Shaking chills.  You have a temperature by mouth above 102 F (38.9 C), not controlled by medicine.  Chest, back, or muscle pain.  People around you feel you are not acting correctly or are confused.  Shortness of breath or difficulty breathing.  Dizziness and fainting.  You get a rash or develop hives.  You have a decrease in urine output.  Your urine turns a dark color or changes to pink, red, or brown. Any of the following symptoms occur over the next 10 days:  You have a temperature by mouth above 102 F (38.9 C), not controlled by medicine.  Shortness of breath.  Weakness after normal activity.  The white part of the eye turns yellow (jaundice).  You have a decrease in the amount of urine or are urinating less often.  Your urine turns a dark color or changes to pink, red, or brown. Document Released: 03/13/2000 Document Revised: 06/08/2011 Document Reviewed: 10/31/2007 [facility] Patient Information 2014 [place], [place].  _______________________________________________________________________

## 2020-08-20 NOTE — Progress Notes (Addendum)
COVID Vaccine Completed: x3 Date COVID Vaccine completed:  04-19-18, 05-23-19 Has received booster:  02-21-2020 COVID vaccine manufacturer: Roland   Date of COVID positive in last 90 days:  N/A  PCP - Arlyss Repress, MD Cardiologist - Geraldo Pitter, MD  Chest x-ray - N/A EKG - 06-14-20 Epic Stress Test - 2018 Care Everywhere ECHO - 2018 Care Everywhere Cardiac Cath - 2017 Care Everywhere Pacemaker/ICD device last checked: Spinal Cord Stimulator:  Yes L hip  Sleep Study - N/A CPAP -   Fasting Blood Sugar - N/A Checks Blood Sugar _____ times a day  Blood Thinner Instructions: Aspirin Instructions:  ASA 81 mg.  Patient remains on ASA Last Dose:  Activity level:  Can go up a flight of stairs and perform activities of daily living without stopping and without symptoms of chest pain or shortness of breath.      Anesthesia review: Hx of MI with stent placement 2017, chronic pain use  Patient denies shortness of breath, fever, cough and chest pain at PAT appointment (completed over the phone)   Patient verbalized understanding of instructions that were given to them at the PAT appointment. Patient was also instructed that they will need to review over the PAT instructions again at home before surgery.

## 2020-08-21 ENCOUNTER — Other Ambulatory Visit: Payer: Self-pay

## 2020-08-21 ENCOUNTER — Encounter (HOSPITAL_COMMUNITY): Payer: Self-pay

## 2020-08-21 ENCOUNTER — Encounter (HOSPITAL_COMMUNITY)
Admission: RE | Admit: 2020-08-21 | Discharge: 2020-08-21 | Disposition: A | Discharge status: Home / Self Care | Payer: PRIVATE HEALTH INSURANCE | Source: Ambulatory Visit | Attending: Orthopedic Surgery | Admitting: Orthopedic Surgery

## 2020-08-21 HISTORY — DX: Depression, unspecified: F32.A

## 2020-08-27 ENCOUNTER — Ambulatory Visit: Payer: Self-pay | Admitting: Student
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
## 2020-09-09 ENCOUNTER — Other Ambulatory Visit (HOSPITAL_COMMUNITY)
Admission: RE | Admit: 2020-09-09 | Discharge: 2020-09-09 | Disposition: A | Discharge status: Home / Self Care | Payer: Medicare Other | Source: Ambulatory Visit | Attending: Orthopedic Surgery | Admitting: Orthopedic Surgery

## 2020-09-09 ENCOUNTER — Other Ambulatory Visit: Payer: Self-pay

## 2020-09-09 ENCOUNTER — Encounter (HOSPITAL_COMMUNITY)
Admission: RE | Admit: 2020-09-09 | Discharge: 2020-09-09 | Disposition: A | Discharge status: Home / Self Care | Payer: Medicare Other | Source: Ambulatory Visit | Attending: Orthopedic Surgery | Admitting: Orthopedic Surgery

## 2020-09-09 DIAGNOSIS — Z20822 Contact with and (suspected) exposure to covid-19: Secondary | ICD-10-CM | POA: Diagnosis not present

## 2020-09-09 DIAGNOSIS — Z01812 Encounter for preprocedural laboratory examination: Secondary | ICD-10-CM | POA: Diagnosis not present

## 2020-09-09 LAB — URINALYSIS, ROUTINE W REFLEX MICROSCOPIC
Bacteria, UA: NONE SEEN
Glucose, UA: NEGATIVE mg/dL
Hgb urine dipstick: NEGATIVE
Ketones, ur: NEGATIVE mg/dL
Nitrite: NEGATIVE
Protein, ur: NEGATIVE mg/dL
Specific Gravity, Urine: 1.032 — ABNORMAL HIGH (ref 1.005–1.030)
pH: 5 (ref 5.0–8.0)

## 2020-09-09 LAB — SARS CORONAVIRUS 2 (TAT 6-24 HRS): SARS Coronavirus 2: NEGATIVE

## 2020-09-09 LAB — SURGICAL PCR SCREEN
MRSA, PCR: NEGATIVE
Staphylococcus aureus: NEGATIVE

## 2020-09-09 LAB — PROTIME-INR
INR: 1 (ref 0.8–1.2)
Prothrombin Time: 12.9 seconds (ref 11.4–15.2)

## 2020-09-09 NOTE — Patient Instructions (Addendum)
DUE TO COVID-19 ONLY ONE VISITOR IS ALLOWED TO COME WITH YOU AND STAY IN THE WAITING ROOM ONLY DURING PRE OP AND PROCEDURE.   **NO VISITORS ARE ALLOWED IN THE SHORT STAY AREA OR RECOVERY ROOM!!**  IF YOU WILL BE ADMITTED INTO THE HOSPITAL YOU ARE ALLOWED ONLY TWO SUPPORT PEOPLE DURING VISITATION HOURS ONLY (10AM -8PM)   The support person(s) may change daily. The support person(s) must pass our screening, gel in and out, and wear a mask at all times, including in the patient's room. Patients must also wear a mask when staff or their support person are in the room.  No visitors under the age of 64. Any visitor under the age of 3 must be accompanied by an adult.    COVID SWAB TESTING MUST BE COMPLETED ON:  06/09/20 @ 1:50 PM   [address] [place], [place] [address]   You are not required to quarantine, however you are required to wear a well-fitted mask when you are out and around people not in your household.  Hand Hygiene often Do NOT share personal items Notify your provider if you are in close contact with someone who has COVID or you develop fever 100.4 or greater, new onset of sneezing, cough, sore throat, shortness of breath or body aches.       Your procedure is scheduled on: 09/11/20   Report to [facility] Main  Entrance    Report to admitting at 5:45 AM   Call this number if you have problems the morning of surgery [phone]   Do not eat food :After Midnight.   May have liquids until 5:15 AM day of surgery  CLEAR LIQUID DIET  Foods Allowed                                                                     Foods Excluded  Water, Black Coffee and tea, regular and decaf               liquids that you cannot  Plain Jell-O in any flavor  (No red)                                     see through such as: Fruit ices (not with fruit pulp)                                             milk, soups, orange juice              Iced Popsicles (No red)                                                  All solid food                                   Apple juices Sports  drinks like Gatorade (No red) Lightly seasoned clear broth or consume(fat free) Sugar, honey syrup     Complete one Ensure drink the morning of surgery at 5:15 AM the day of surgery.      The day of surgery:  Drink ONE (1) Pre-Surgery Clear Ensure by 5:15 am the morning of surgery. Drink in one sitting. Do not sip.  This drink was given to you during your hospital  pre-op appointment visit. Nothing else to drink after completing the  Pre-Surgery Clear Ensure.          If you have questions, please contact your surgeon's office.     Oral Hygiene is also important to reduce your risk of infection.                                    Remember - BRUSH YOUR TEETH THE MORNING OF SURGERY WITH YOUR REGULAR TOOTHPASTE   Do NOT smoke after Midnight   Take these medicines the morning of surgery with A SIP OF WATER: Alprazolam, Bupropion, Escitalopram, Gabapentin, Oxycodone, Predinsone.                               You may not have any metal on your body including hair pins, jewelry, and body piercing             Do not wear make-up, lotions, powders, perfumes, or deodorant  Do not wear nail polish including gel and S&S, artificial/acrylic nails, or any other type of covering on natural nails including finger and toenails. If you have artificial nails, gel coating, etc. that needs to be removed by a nail salon please have this removed prior to surgery or surgery may need to be canceled/ delayed if the surgeon/ anesthesia feels like they are unable to be safely monitored.   Do not shave  48 hours prior to surgery.    Do not bring valuables to the hospital. [place].   Contacts, dentures or bridgework may not be worn into surgery.   Bring small overnight bag day of surgery.  Special Instructions: Bring a copy of your healthcare power of attorney  and living will documents         the day of surgery if you haven't scanned them in before.              Please read over the following fact sheets you were given: IF YOU HAVE QUESTIONS ABOUT YOUR PRE OP INSTRUCTIONS PLEASE CALL [phone]   [facility] - Preparing for Surgery Before surgery, you can play an important role.  Because skin is not sterile, your skin needs to be as free of germs as possible.  You can reduce the number of germs on your skin by washing with CHG (chlorahexidine gluconate) soap before surgery.  CHG is an antiseptic cleaner which kills germs and bonds with the skin to continue killing germs even after washing. Please DO NOT use if you have an allergy to CHG or antibacterial soaps.  If your skin becomes reddened/irritated stop using the CHG and inform your nurse when you arrive at Short Stay. Do not shave (including legs and underarms) for at least 48 hours prior to the first CHG shower.  You may shave your face/neck.  Please follow these instructions carefully:  1.  Shower with CHG Soap the night before surgery and the  morning of surgery.  2.  If you choose to wash your hair, wash your hair first as usual with your normal  shampoo.  3.  After you shampoo, rinse your hair and body thoroughly to remove the shampoo.                             4.  Use CHG as you would any other liquid soap.  You can apply chg directly to the skin and wash.  Gently with a scrungie or clean washcloth.  5.  Apply the CHG Soap to your body ONLY FROM THE NECK DOWN.   Do   not use on face/ open                           Wound or open sores. Avoid contact with eyes, ears mouth and   genitals (private parts).                       Wash face,  Genitals (private parts) with your normal soap.             6.  Wash thoroughly, paying special attention to the area where your    surgery  will be performed.  7.  Thoroughly rinse your body with warm water from the neck down.  8.  DO NOT shower/wash with  your normal soap after using and rinsing off the CHG Soap.                9.  Pat yourself dry with a clean towel.            10.  Wear clean pajamas.            11.  Place clean sheets on your bed the night of your first shower and do not  sleep with pets. Day of Surgery : Do not apply any lotions/deodorants the morning of surgery.  Please wear clean clothes to the hospital/surgery center.  FAILURE TO FOLLOW THESE INSTRUCTIONS MAY RESULT IN THE CANCELLATION OF YOUR SURGERY  PATIENT SIGNATURE_________________________________  NURSE SIGNATURE__________________________________  ________________________________________________________________________   Adam Phenix  An incentive spirometer is a tool that can help keep your lungs clear and active. This tool measures how well you are filling your lungs with each breath. Taking long deep breaths may help reverse or decrease the chance of developing breathing (pulmonary) problems (especially infection) following: A long period of time when you are unable to move or be active. BEFORE THE PROCEDURE  If the spirometer includes an indicator to show your best effort, your nurse or respiratory therapist will set it to a desired goal. If possible, sit up straight or lean slightly forward. Try not to slouch. Hold the incentive spirometer in an upright position. INSTRUCTIONS FOR USE  Sit on the edge of your bed if possible, or sit up as far as you can in bed or on a chair. Hold the incentive spirometer in an upright position. Breathe out normally. Place the mouthpiece in your mouth and seal your lips tightly around it. Breathe in slowly and as deeply as possible, raising the piston or the ball toward the top of the column. Hold your breath for 3-5 seconds or for as long as possible. Allow the piston or ball to  fall to the bottom of the column. Remove the mouthpiece from your mouth and breathe out normally. Rest for a few seconds and repeat  Steps 1 through 7 at least 10 times every 1-2 hours when you are awake. Take your time and take a few normal breaths between deep breaths. The spirometer may include an indicator to show your best effort. Use the indicator as a goal to work toward during each repetition. After each set of 10 deep breaths, practice coughing to be sure your lungs are clear. If you have an incision (the cut made at the time of surgery), support your incision when coughing by placing a pillow or rolled up towels firmly against it. Once you are able to get out of bed, walk around indoors and cough well. You may stop using the incentive spirometer when instructed by your caregiver.  RISKS AND COMPLICATIONS Take your time so you do not get dizzy or light-headed. If you are in pain, you may need to take or ask for pain medication before doing incentive spirometry. It is harder to take a deep breath if you are having pain. AFTER USE Rest and breathe slowly and easily. It can be helpful to keep track of a log of your progress. Your caregiver can provide you with a simple table to help with this. If you are using the spirometer at home, follow these instructions: [place] IF:  You are having difficultly using the spirometer. You have trouble using the spirometer as often as instructed. Your pain medication is not giving enough relief while using the spirometer. You develop fever of 100.5 F (38.1 C) or higher. SEEK IMMEDIATE MEDICAL CARE IF:  You cough up bloody sputum that had not been present before. You develop fever of 102 F (38.9 C) or greater. You develop worsening pain at or near the incision site. MAKE SURE YOU:  Understand these instructions. Will watch your condition. Will get help right away if you are not doing well or get worse. Document Released: 07/27/2006 Document Revised: 06/08/2011 Document Reviewed: 09/27/2006 Exit[employer] Patient Information 2014 ExitCare,  [place].   ________________________________________________________________________  WHAT IS A BLOOD TRANSFUSION? Blood Transfusion Information  A transfusion is the replacement of blood or some of its parts. Blood is made up of multiple cells which provide different functions. Red blood cells carry oxygen and are used for blood loss replacement. White blood cells fight against infection. Platelets control bleeding. Plasma helps clot blood. Other blood products are available for specialized needs, such as hemophilia or other clotting disorders. BEFORE THE TRANSFUSION  Who gives blood for transfusions?  Healthy volunteers who are fully evaluated to make sure their blood is safe. This is blood bank blood. Transfusion therapy is the safest it has ever been in the practice of medicine. Before blood is taken from a donor, a complete history is taken to make sure that person has no history of diseases nor engages in risky social behavior (examples are intravenous drug use or sexual activity with multiple partners). The donor's travel history is screened to minimize risk of transmitting infections, such as malaria. The donated blood is tested for signs of infectious diseases, such as HIV and hepatitis. The blood is then tested to be sure it is compatible with you in order to minimize the chance of a transfusion reaction. If you or a relative donates blood, this is often done in anticipation of surgery and is not appropriate for emergency situations. It takes many days to process the  donated blood. RISKS AND COMPLICATIONS Although transfusion therapy is very safe and saves many lives, the main dangers of transfusion include:  Getting an infectious disease. Developing a transfusion reaction. This is an allergic reaction to something in the blood you were given. Every precaution is taken to prevent this. The decision to have a blood transfusion has been considered carefully by your caregiver before blood is  given. Blood is not given unless the benefits outweigh the risks. AFTER THE TRANSFUSION Right after receiving a blood transfusion, you will usually feel much better and more energetic. This is especially true if your red blood cells have gotten low (anemic). The transfusion raises the level of the red blood cells which carry oxygen, and this usually causes an energy increase. The nurse administering the transfusion will monitor you carefully for complications. HOME CARE INSTRUCTIONS  No special instructions are needed after a transfusion. You may find your energy is better. Speak with your caregiver about any limitations on activity for underlying diseases you may have. SEEK MEDICAL CARE IF:  Your condition is not improving after your transfusion. You develop redness or irritation at the intravenous (IV) site. SEEK IMMEDIATE MEDICAL CARE IF:  Any of the following symptoms occur over the next 12 hours: Shaking chills. You have a temperature by mouth above 102 F (38.9 C), not controlled by medicine. Chest, back, or muscle pain. People around you feel you are not acting correctly or are confused. Shortness of breath or difficulty breathing. Dizziness and fainting. You get a rash or develop hives. You have a decrease in urine output. Your urine turns a dark color or changes to pink, red, or brown. Any of the following symptoms occur over the next 10 days: You have a temperature by mouth above 102 F (38.9 C), not controlled by medicine. Shortness of breath. Weakness after normal activity. The white part of the eye turns yellow (jaundice). You have a decrease in the amount of urine or are urinating less often. Your urine turns a dark color or changes to pink, red, or brown. Document Released: 03/13/2000 Document Revised: 06/08/2011 Document Reviewed: 10/31/2007 [facility] Patient Information 2014 [place], [place].  _______________________________________________________________________

## 2020-09-10 NOTE — Anesthesia Preprocedure Evaluation (Addendum)
Anesthesia Evaluation  Patient identified by MRN, date of birth, ID band Patient awake    Reviewed: Allergy & Precautions, NPO status , Patient's Chart, lab work & pertinent test results  History of Anesthesia Complications (+) POST - OP SPINAL HEADACHE and history of anesthetic complications  Airway Mallampati: II       Dental no notable dental hx. (+) Teeth Intact   Pulmonary former smoker,    Pulmonary exam normal breath sounds clear to auscultation       Cardiovascular + Past MI and + Cardiac Stents  Normal cardiovascular exam     Neuro/Psych  Headaches, PSYCHIATRIC DISORDERS Anxiety Depression    GI/Hepatic   Endo/Other    Renal/GU   negative genitourinary   Musculoskeletal  (+) Arthritis , Osteoarthritis,    Abdominal Normal abdominal exam  (+)   Peds  Hematology   Anesthesia Other Findings   Reproductive/Obstetrics                            Anesthesia Physical Anesthesia Plan  ASA: 2  Anesthesia Plan: Spinal   Post-op Pain Management:  Regional for Post-op pain   Induction:   PONV Risk Score and Plan: 2 and Ondansetron and Dexamethasone  Airway Management Planned: Natural Airway and Simple Face Mask  Additional Equipment: None  Intra-op Plan:   Post-operative Plan:   Informed Consent: I have reviewed the patients History and Physical, chart, labs and discussed the procedure including the risks, benefits and alternatives for the proposed anesthesia with the patient or authorized representative who has indicated his/her understanding and acceptance.       Plan Discussed with: CRNA  Anesthesia Plan Comments: (Per PCP note 08/28/20, "Patient was scheduled for right total knee replacement. However before the procedure she fell and surgery was canceled. She had a hematoma in her right shin area which lasted about 3 months but now has finally resolved. She has history of  coronary disease, status post MI and stent placement. No chest pain or shortness of breath recently. She has been on chronic steroids for her rheumatoid arthritis but before orthopedics would operate on her they had her reduce her dose down to 5 mg a day from 20 mg/day. Also she has stopped her methotrexate before the procedure. She states her depression is worse. She continues Lexapro 20 mg daily and Xanax as needed. I do not think this is a good time for her to change antidepressant medications. We will discuss that after her surgery. She is medically clear for knee replacement surgery.")      Anesthesia Quick Evaluation                                  Anesthesia Evaluation  Patient identified by MRN, date of birth, ID band Patient awake    Reviewed: Allergy & Precautions, H&P , NPO status , Patient's Chart, lab work & pertinent test results, reviewed documented beta blocker date and time   Airway Mallampati: II  TM Distance: >3 FB Neck ROM: full    Dental no notable dental hx.    Pulmonary neg pulmonary ROS, former smoker,    Pulmonary exam normal breath sounds clear to auscultation       Cardiovascular Exercise Tolerance: Good + Past MI and + Cardiac Stents   Rhythm:regular Rate:Normal     Neuro/Psych  Headaches, Anxiety negative psych ROS  GI/Hepatic GERD  ,(+) Hepatitis -  Endo/Other  negative endocrine ROS  Renal/GU negative Renal ROS  negative genitourinary   Musculoskeletal  (+) Arthritis , Osteoarthritis and Rheumatoid disorders,    Abdominal   Peds  Hematology negative hematology ROS (+)   Anesthesia Other Findings   Reproductive/Obstetrics negative OB ROS                            Anesthesia Physical Anesthesia Plan  ASA: III  Anesthesia Plan: Spinal and MAC   Post-op Pain Management:  Regional for Post-op pain   Induction:   PONV Risk Score and Plan: 2 and Ondansetron  Airway Management Planned:    Additional Equipment: None  Intra-op Plan:   Post-operative Plan:   Informed Consent: I have reviewed the patients History and Physical, chart, labs and discussed the procedure including the risks, benefits and alternatives for the proposed anesthesia with the patient or authorized representative who has indicated his/her understanding and acceptance.     Dental Advisory Given  Plan Discussed with: CRNA and Anesthesiologist  Anesthesia Plan Comments: (80 y.o. former smoker with h/o GERD, CAD (stent 2017), RA, right knee djd scheduled for above procedure 05/16/2020 with Dr. Rod Can.   Pt last seen by internal medicine 04/30/20 for preoperative evaluation. Per OV note, "Coronary disease has been asymptomatic since stent placement and MI in 2017. She has osteoarthritis and has been diagnosed with seronegative rheumatoid arthritis as well. Labs were recently checked by her rheumatologist and looked okay. 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 We will add Wellbutrin XL 150 mg daily."    EKG: 05/13/2020 Rate 81 bpm  Normal sinus rhythm with sinus arrhythmia Normal ECG No significant change since last tracing  CV: Echo 06/26/2016 SUMMARY  The left ventricular size is normal.  There is normal left ventricularwall thickness.   Left ventricular systolic function is normal.  LV ejection fraction = 55-60%.   Left ventricular filling pattern is pseudonormal.  The right ventricle is normal in size and function.  There is no atrial enlargement.  There is mild mitral and tricuspid regurgitation.  No pulmonary hypertension.  IVC size was normal.  There is no pericardial effusion.  Compared to prior echo 10/20/15, there is no significant change.   Stress Test 06/26/2016  The patient had no chest pain.  The patient achieved 88 % of maximum predicted heart rate.  The METS achieved was 8.  Exercise capacity was average.  Negative stress ECG for inducible ischemia at  target heart rate.  The estimated LV ejection fraction is 60-65% .  There were no segmental wall motion abnormalities at rest  The estimated LV ejection fraction is 65-70% with stress.    CASE CANCELED PER SURGEON,  PT ON STEROIDS  WITH FRIABLE SKIN.    )      Anesthesia Quick Evaluation

## 2020-09-11 ENCOUNTER — Ambulatory Visit (HOSPITAL_COMMUNITY): Payer: Medicare Other

## 2020-09-11 ENCOUNTER — Ambulatory Visit (HOSPITAL_COMMUNITY): Payer: Medicare Other | Admitting: Physician Assistant

## 2020-09-11 ENCOUNTER — Encounter (HOSPITAL_COMMUNITY)
Admission: RE | Disposition: A | Discharge status: Home / Self Care | Payer: Self-pay | Source: Home / Self Care | Attending: Orthopedic Surgery

## 2020-09-11 ENCOUNTER — Encounter (HOSPITAL_COMMUNITY): Payer: Self-pay | Admitting: Orthopedic Surgery

## 2020-09-11 ENCOUNTER — Other Ambulatory Visit: Payer: Self-pay

## 2020-09-11 ENCOUNTER — Ambulatory Visit (HOSPITAL_COMMUNITY)
Admission: RE | Admit: 2020-09-11 | Discharge: 2020-09-12 | Disposition: A | Discharge status: Home / Self Care | Payer: Medicare Other | Attending: Orthopedic Surgery | Admitting: Orthopedic Surgery

## 2020-09-11 DIAGNOSIS — Z87891 Personal history of nicotine dependence: Secondary | ICD-10-CM | POA: Insufficient documentation

## 2020-09-11 DIAGNOSIS — Z880 Allergy status to penicillin: Secondary | ICD-10-CM | POA: Insufficient documentation

## 2020-09-11 DIAGNOSIS — Z882 Allergy status to sulfonamides status: Secondary | ICD-10-CM | POA: Insufficient documentation

## 2020-09-11 DIAGNOSIS — Z79899 Other long term (current) drug therapy: Secondary | ICD-10-CM | POA: Diagnosis not present

## 2020-09-11 DIAGNOSIS — M1711 Unilateral primary osteoarthritis, right knee: Secondary | ICD-10-CM | POA: Diagnosis present

## 2020-09-11 DIAGNOSIS — Z7982 Long term (current) use of aspirin: Secondary | ICD-10-CM | POA: Diagnosis not present

## 2020-09-11 DIAGNOSIS — Z7952 Long term (current) use of systemic steroids: Secondary | ICD-10-CM | POA: Insufficient documentation

## 2020-09-11 HISTORY — PX: KNEE ARTHROPLASTY: SHX992

## 2020-09-11 LAB — TYPE AND SCREEN
ABO/RH(D): A POS
Antibody Screen: NEGATIVE

## 2020-09-11 SURGERY — ARTHROPLASTY, KNEE, TOTAL, USING IMAGELESS COMPUTER-ASSISTED NAVIGATION
Anesthesia: Spinal | Site: Knee | Laterality: Right

## 2020-09-11 MED ORDER — LACTATED RINGERS IV SOLN: INTRAVENOUS | Status: DC

## 2020-09-11 MED ORDER — METOCLOPRAMIDE HCL 5 MG/ML IJ SOLN: 5.0000 mg | Freq: Three times a day (TID) | INTRAMUSCULAR | Status: DC | PRN

## 2020-09-11 MED ORDER — DIPHENHYDRAMINE HCL 12.5 MG/5ML PO ELIX: 12.5000 mg | ORAL_SOLUTION | ORAL | Status: DC | PRN

## 2020-09-11 MED ORDER — PHENYLEPHRINE HCL (PRESSORS) 10 MG/ML IV SOLN
INTRAVENOUS | Status: DC | PRN
  Administered 2020-09-11: 120 ug via INTRAVENOUS

## 2020-09-11 MED ORDER — ACETAMINOPHEN 10 MG/ML IV SOLN: 1000.0000 mg | Freq: Once | INTRAVENOUS | Status: DC | PRN

## 2020-09-11 MED ORDER — PHENYLEPHRINE HCL-NACL 10-0.9 MG/250ML-% IV SOLN
INTRAVENOUS | Status: DC | PRN
  Administered 2020-09-11: 50 ug/min via INTRAVENOUS

## 2020-09-11 MED ORDER — OXYCODONE HCL 5 MG PO TABS
5.0000 mg | ORAL_TABLET | ORAL | Status: DC | PRN
  Administered 2020-09-11: 5 mg via ORAL
  Administered 2020-09-11 – 2020-09-12 (×3): 10 mg via ORAL
  Filled 2020-09-11 (×2): qty 2
  Filled 2020-09-11: qty 1
  Filled 2020-09-11 (×2): qty 2

## 2020-09-11 MED ORDER — PREDNISONE 5 MG PO TABS
5.0000 mg | ORAL_TABLET | Freq: Every day | ORAL | Status: DC
  Administered 2020-09-12: 5 mg via ORAL
  Filled 2020-09-11 (×2): qty 1

## 2020-09-11 MED ORDER — ALPRAZOLAM 0.25 MG PO TABS
0.2500 mg | ORAL_TABLET | Freq: Two times a day (BID) | ORAL | Status: DC | PRN
  Administered 2020-09-12: 0.25 mg via ORAL
  Filled 2020-09-11 (×2): qty 1

## 2020-09-11 MED ORDER — BUPIVACAINE-EPINEPHRINE (PF) 0.25% -1:200000 IJ SOLN
INTRAMUSCULAR | Status: AC
  Filled 2020-09-11: qty 30

## 2020-09-11 MED ORDER — PROPOFOL 1000 MG/100ML IV EMUL
INTRAVENOUS | Status: AC
  Filled 2020-09-11: qty 100

## 2020-09-11 MED ORDER — FENTANYL CITRATE (PF) 100 MCG/2ML IJ SOLN: 25.0000 ug | INTRAMUSCULAR | Status: DC | PRN

## 2020-09-11 MED ORDER — METHOCARBAMOL 500 MG PO TABS
500.0000 mg | ORAL_TABLET | Freq: Four times a day (QID) | ORAL | Status: DC | PRN
  Administered 2020-09-11: 500 mg via ORAL
  Filled 2020-09-11: qty 1

## 2020-09-11 MED ORDER — BUPIVACAINE IN DEXTROSE 0.75-8.25 % IT SOLN
INTRATHECAL | Status: DC | PRN
  Administered 2020-09-11: 1.7 mL via INTRATHECAL

## 2020-09-11 MED ORDER — FOLIC ACID 1 MG PO TABS
1000.0000 ug | ORAL_TABLET | Freq: Every day | ORAL | Status: DC
  Administered 2020-09-11 – 2020-09-12 (×2): 1 mg via ORAL
  Filled 2020-09-11 (×3): qty 1

## 2020-09-11 MED ORDER — CEFAZOLIN SODIUM-DEXTROSE 2-4 GM/100ML-% IV SOLN
2.0000 g | INTRAVENOUS | Status: AC
  Administered 2020-09-11: 2 g via INTRAVENOUS
  Filled 2020-09-11: qty 100

## 2020-09-11 MED ORDER — MENTHOL 3 MG MT LOZG: 1.0000 | LOZENGE | OROMUCOSAL | Status: DC | PRN

## 2020-09-11 MED ORDER — NALOXONE HCL 0.4 MG/ML IJ SOLN
INTRAMUSCULAR | Status: AC
  Administered 2020-09-11: 0.08 mg via INTRAVENOUS
  Filled 2020-09-11: qty 1

## 2020-09-11 MED ORDER — KETOROLAC TROMETHAMINE 30 MG/ML IJ SOLN
INTRAMUSCULAR | Status: DC | PRN
  Administered 2020-09-11: 30 mg via INTRA_ARTICULAR

## 2020-09-11 MED ORDER — DEXAMETHASONE SODIUM PHOSPHATE 10 MG/ML IJ SOLN
10.0000 mg | Freq: Once | INTRAMUSCULAR | Status: AC
  Administered 2020-09-12: 10 mg via INTRAVENOUS
  Filled 2020-09-11: qty 1

## 2020-09-11 MED ORDER — SODIUM CHLORIDE 0.9 % IR SOLN
Status: DC | PRN
  Administered 2020-09-11: 1000 mL

## 2020-09-11 MED ORDER — FENTANYL CITRATE (PF) 100 MCG/2ML IJ SOLN
INTRAMUSCULAR | Status: DC | PRN
  Administered 2020-09-11: 50 ug via INTRAVENOUS

## 2020-09-11 MED ORDER — BUPIVACAINE-EPINEPHRINE 0.25% -1:200000 IJ SOLN
INTRAMUSCULAR | Status: DC | PRN
  Administered 2020-09-11: 30 mL

## 2020-09-11 MED ORDER — CEFAZOLIN SODIUM-DEXTROSE 2-4 GM/100ML-% IV SOLN
2.0000 g | Freq: Four times a day (QID) | INTRAVENOUS | Status: AC
  Administered 2020-09-11 (×2): 2 g via INTRAVENOUS
  Filled 2020-09-11 (×2): qty 100

## 2020-09-11 MED ORDER — ONDANSETRON HCL 4 MG/2ML IJ SOLN: 4.0000 mg | Freq: Four times a day (QID) | INTRAMUSCULAR | Status: DC | PRN

## 2020-09-11 MED ORDER — POVIDONE-IODINE 10 % EX SWAB: 2.0000 "application " | Freq: Once | CUTANEOUS | Status: DC

## 2020-09-11 MED ORDER — FENTANYL CITRATE (PF) 100 MCG/2ML IJ SOLN
INTRAMUSCULAR | Status: AC
  Filled 2020-09-11: qty 2

## 2020-09-11 MED ORDER — PROPOFOL 10 MG/ML IV BOLUS
INTRAVENOUS | Status: AC
  Filled 2020-09-11: qty 20

## 2020-09-11 MED ORDER — PHENYLEPHRINE HCL (PRESSORS) 10 MG/ML IV SOLN
INTRAVENOUS | Status: AC
  Filled 2020-09-11: qty 1

## 2020-09-11 MED ORDER — ESCITALOPRAM OXALATE 20 MG PO TABS
20.0000 mg | ORAL_TABLET | Freq: Every day | ORAL | Status: DC
  Administered 2020-09-12: 20 mg via ORAL
  Filled 2020-09-11: qty 1

## 2020-09-11 MED ORDER — ACETAMINOPHEN 10 MG/ML IV SOLN
1000.0000 mg | Freq: Once | INTRAVENOUS | Status: AC
  Administered 2020-09-11: 1000 mg via INTRAVENOUS
  Filled 2020-09-11: qty 100

## 2020-09-11 MED ORDER — CLONIDINE HCL (ANALGESIA) 100 MCG/ML EP SOLN
EPIDURAL | Status: DC | PRN
  Administered 2020-09-11: 100 ug

## 2020-09-11 MED ORDER — VITAMIN B-12 1000 MCG PO TABS
1000.0000 ug | ORAL_TABLET | Freq: Every day | ORAL | Status: DC
  Administered 2020-09-11 – 2020-09-12 (×2): 1000 ug via ORAL
  Filled 2020-09-11 (×3): qty 1

## 2020-09-11 MED ORDER — SODIUM CHLORIDE (PF) 0.9 % IJ SOLN
INTRAMUSCULAR | Status: DC | PRN
  Administered 2020-09-11: 30 mL

## 2020-09-11 MED ORDER — CHLORHEXIDINE GLUCONATE 0.12 % MT SOLN
15.0000 mL | Freq: Once | OROMUCOSAL | Status: AC
  Administered 2020-09-11: 15 mL via OROMUCOSAL

## 2020-09-11 MED ORDER — METHOCARBAMOL 1000 MG/10ML IJ SOLN
500.0000 mg | Freq: Four times a day (QID) | INTRAVENOUS | Status: DC | PRN
  Filled 2020-09-11: qty 5

## 2020-09-11 MED ORDER — PHENYLEPHRINE 40 MCG/ML (10ML) SYRINGE FOR IV PUSH (FOR BLOOD PRESSURE SUPPORT)
PREFILLED_SYRINGE | INTRAVENOUS | Status: AC
  Filled 2020-09-11: qty 10

## 2020-09-11 MED ORDER — DEXAMETHASONE SODIUM PHOSPHATE 10 MG/ML IJ SOLN
INTRAMUSCULAR | Status: DC | PRN
  Administered 2020-09-11: 10 mg

## 2020-09-11 MED ORDER — ONDANSETRON HCL 4 MG/2ML IJ SOLN
INTRAMUSCULAR | Status: AC
  Filled 2020-09-11: qty 2

## 2020-09-11 MED ORDER — ONDANSETRON HCL 4 MG/2ML IJ SOLN: 4.0000 mg | Freq: Once | INTRAMUSCULAR | Status: DC | PRN

## 2020-09-11 MED ORDER — ROPIVACAINE HCL 5 MG/ML IJ SOLN
INTRAMUSCULAR | Status: DC | PRN
  Administered 2020-09-11 (×2): 5 mL via PERINEURAL

## 2020-09-11 MED ORDER — POLYETHYLENE GLYCOL 3350 17 G PO PACK: 17.0000 g | PACK | Freq: Every day | ORAL | Status: DC | PRN

## 2020-09-11 MED ORDER — ROPIVACAINE HCL 7.5 MG/ML IJ SOLN
INTRAMUSCULAR | Status: DC | PRN
  Administered 2020-09-11 (×4): 5 mL via PERINEURAL

## 2020-09-11 MED ORDER — POVIDONE-IODINE 10 % EX SWAB
2.0000 "application " | Freq: Once | CUTANEOUS | Status: AC
  Administered 2020-09-11: 2 via TOPICAL

## 2020-09-11 MED ORDER — HYDROXYCHLOROQUINE SULFATE 200 MG PO TABS
200.0000 mg | ORAL_TABLET | Freq: Two times a day (BID) | ORAL | Status: DC
  Administered 2020-09-11 – 2020-09-12 (×3): 200 mg via ORAL
  Filled 2020-09-11 (×3): qty 1

## 2020-09-11 MED ORDER — SODIUM CHLORIDE 0.9 % IV SOLN: INTRAVENOUS | Status: DC

## 2020-09-11 MED ORDER — OXYCODONE HCL 5 MG PO TABS: 10.0000 mg | ORAL_TABLET | ORAL | Status: DC | PRN

## 2020-09-11 MED ORDER — ISOPROPYL ALCOHOL 70 % SOLN
Status: DC | PRN
  Administered 2020-09-11: 1 via TOPICAL

## 2020-09-11 MED ORDER — MIDAZOLAM HCL 5 MG/5ML IJ SOLN
INTRAMUSCULAR | Status: DC | PRN
  Administered 2020-09-11: 1 mg via INTRAVENOUS

## 2020-09-11 MED ORDER — PHENOL 1.4 % MT LIQD: 1.0000 | OROMUCOSAL | Status: DC | PRN

## 2020-09-11 MED ORDER — MIDAZOLAM HCL 2 MG/2ML IJ SOLN
INTRAMUSCULAR | Status: AC
  Filled 2020-09-11: qty 2

## 2020-09-11 MED ORDER — ORAL CARE MOUTH RINSE: 15.0000 mL | Freq: Once | OROMUCOSAL | Status: AC

## 2020-09-11 MED ORDER — GABAPENTIN 300 MG PO CAPS
300.0000 mg | ORAL_CAPSULE | Freq: Two times a day (BID) | ORAL | Status: DC
  Administered 2020-09-11 – 2020-09-12 (×2): 300 mg via ORAL
  Filled 2020-09-11 (×2): qty 1

## 2020-09-11 MED ORDER — TRANEXAMIC ACID-NACL 1000-0.7 MG/100ML-% IV SOLN
1000.0000 mg | INTRAVENOUS | Status: AC
  Administered 2020-09-11: 1000 mg via INTRAVENOUS
  Filled 2020-09-11: qty 100

## 2020-09-11 MED ORDER — KETOROLAC TROMETHAMINE 30 MG/ML IJ SOLN
INTRAMUSCULAR | Status: AC
  Filled 2020-09-11: qty 1

## 2020-09-11 MED ORDER — NALOXONE HCL 0.4 MG/ML IJ SOLN: 0.0800 mg | INTRAMUSCULAR | Status: DC | PRN

## 2020-09-11 MED ORDER — HYDROMORPHONE HCL 1 MG/ML IJ SOLN: 0.5000 mg | INTRAMUSCULAR | Status: DC | PRN

## 2020-09-11 MED ORDER — PROPOFOL 10 MG/ML IV BOLUS
INTRAVENOUS | Status: DC | PRN
  Administered 2020-09-11: 30 mg via INTRAVENOUS

## 2020-09-11 MED ORDER — METOCLOPRAMIDE HCL 5 MG PO TABS: 5.0000 mg | ORAL_TABLET | Freq: Three times a day (TID) | ORAL | Status: DC | PRN

## 2020-09-11 MED ORDER — STERILE WATER FOR IRRIGATION IR SOLN
Status: DC | PRN
  Administered 2020-09-11: 2000 mL

## 2020-09-11 MED ORDER — DOCUSATE SODIUM 100 MG PO CAPS
100.0000 mg | ORAL_CAPSULE | Freq: Two times a day (BID) | ORAL | Status: DC
  Filled 2020-09-11 (×2): qty 1

## 2020-09-11 MED ORDER — ALUM & MAG HYDROXIDE-SIMETH 200-200-20 MG/5ML PO SUSP: 30.0000 mL | ORAL | Status: DC | PRN

## 2020-09-11 MED ORDER — PROPOFOL 500 MG/50ML IV EMUL
INTRAVENOUS | Status: DC | PRN
  Administered 2020-09-11: 50 ug/kg/min via INTRAVENOUS

## 2020-09-11 MED ORDER — ASPIRIN 81 MG PO CHEW
81.0000 mg | CHEWABLE_TABLET | Freq: Two times a day (BID) | ORAL | Status: DC
  Administered 2020-09-11 – 2020-09-12 (×2): 81 mg via ORAL
  Filled 2020-09-11 (×3): qty 1

## 2020-09-11 MED ORDER — BUPROPION HCL ER (XL) 150 MG PO TB24
150.0000 mg | ORAL_TABLET | Freq: Every day | ORAL | Status: DC
  Administered 2020-09-12: 150 mg via ORAL
  Filled 2020-09-11: qty 1

## 2020-09-11 MED ORDER — ONDANSETRON HCL 4 MG PO TABS: 4.0000 mg | ORAL_TABLET | Freq: Four times a day (QID) | ORAL | Status: DC | PRN

## 2020-09-11 MED ORDER — DEXAMETHASONE SODIUM PHOSPHATE 10 MG/ML IJ SOLN
INTRAMUSCULAR | Status: AC
  Filled 2020-09-11: qty 1

## 2020-09-11 MED ORDER — ONDANSETRON HCL 4 MG/2ML IJ SOLN
INTRAMUSCULAR | Status: DC | PRN
  Administered 2020-09-11: 4 mg via INTRAVENOUS

## 2020-09-11 MED ORDER — ACETAMINOPHEN 325 MG PO TABS: 325.0000 mg | ORAL_TABLET | Freq: Four times a day (QID) | ORAL | Status: DC | PRN

## 2020-09-11 SURGICAL SUPPLY — 84 items
ADH SKN CLS APL DERMABOND .7 (GAUZE/BANDAGES/DRESSINGS) ×1
APL PRP STRL LF DISP 70% ISPRP (MISCELLANEOUS) ×2
ATTUNE MED DOME PAT 38 KNEE (Knees) ×1 IMPLANT
ATTUNE MED DOME PAT 38MM KNEE (Knees) ×1 IMPLANT
BAG SPEC THK2 15X12 ZIP CLS (MISCELLANEOUS)
BAG ZIPLOCK 12X15 (MISCELLANEOUS) IMPLANT
BATTERY INSTRU NAVIGATION (MISCELLANEOUS) ×9 IMPLANT
BLADE SAW RECIPROCATING 77.5 (BLADE) ×3 IMPLANT
BNDG CMPR MED 10X6 ELC LF (GAUZE/BANDAGES/DRESSINGS) ×1
BNDG ELASTIC 4X5.8 VLCR STR LF (GAUZE/BANDAGES/DRESSINGS) ×3 IMPLANT
BNDG ELASTIC 6X10 VLCR STRL LF (GAUZE/BANDAGES/DRESSINGS) ×2 IMPLANT
BNDG ELASTIC 6X5.8 VLCR STR LF (GAUZE/BANDAGES/DRESSINGS) ×3 IMPLANT
BONE CEMENT GENTAMICIN (Cement) ×3 IMPLANT
BSPLAT TIB 5 FXBRNG KN ATTUNE (Insert) ×1 IMPLANT
BTRY SRG DRVR LF (MISCELLANEOUS) ×3
CEMENT BONE GENTAMICIN 40 (Cement) ×1 IMPLANT
CHLORAPREP W/TINT 26 (MISCELLANEOUS) ×6 IMPLANT
COMP FEM CMTLS ATTUNE (Joint) ×3 IMPLANT
COMPONENT FEM CMTLS ATTUNE (Joint) IMPLANT
COVER SURGICAL LIGHT HANDLE (MISCELLANEOUS) ×3 IMPLANT
COVER WAND RF STERILE (DRAPES) IMPLANT
CUFF TOURN SGL QUICK 34 (TOURNIQUET CUFF) ×3
CUFF TRNQT CYL 34X4.125X (TOURNIQUET CUFF) ×1 IMPLANT
DECANTER SPIKE VIAL GLASS SM (MISCELLANEOUS) ×6 IMPLANT
DERMABOND ADVANCED (GAUZE/BANDAGES/DRESSINGS) ×2
DERMABOND ADVANCED .7 DNX12 (GAUZE/BANDAGES/DRESSINGS) ×2 IMPLANT
DRAPE SHEET LG 3/4 BI-LAMINATE (DRAPES) ×9 IMPLANT
DRAPE U-SHAPE 47X51 STRL (DRAPES) ×3 IMPLANT
DRSG AQUACEL AG ADV 3.5X10 (GAUZE/BANDAGES/DRESSINGS) ×3 IMPLANT
DRSG TEGADERM 4X4.75 (GAUZE/BANDAGES/DRESSINGS) IMPLANT
ELECT BLADE TIP CTD 4 INCH (ELECTRODE) ×3 IMPLANT
ELECT REM PT RETURN 15FT ADLT (MISCELLANEOUS) ×3 IMPLANT
EVACUATOR 1/8 PVC DRAIN (DRAIN) IMPLANT
GAUZE SPONGE 4X4 12PLY STRL (GAUZE/BANDAGES/DRESSINGS) ×3 IMPLANT
GLOVE SRG 8 PF TXTR STRL LF DI (GLOVE) ×2 IMPLANT
GLOVE SURG ENC MOIS LTX SZ8.5 (GLOVE) ×6 IMPLANT
GLOVE SURG ENC TEXT LTX SZ7.5 (GLOVE) ×9 IMPLANT
GLOVE SURG UNDER POLY LF SZ8 (GLOVE) ×6
GLOVE SURG UNDER POLY LF SZ8.5 (GLOVE) ×3 IMPLANT
GOWN SPEC L3 XXLG W/TWL (GOWN DISPOSABLE) ×3 IMPLANT
GOWN SPEC L4 XLG W/TWL (GOWN DISPOSABLE) ×3 IMPLANT
HANDPIECE INTERPULSE COAX TIP (DISPOSABLE) ×3
HOLDER FOLEY CATH W/STRAP (MISCELLANEOUS) ×3 IMPLANT
HOOD PEEL AWAY FLYTE STAYCOOL (MISCELLANEOUS) ×9 IMPLANT
INSERT TIB CMT ATTUNE 5 (Insert) ×2 IMPLANT
INSERT TIB CR FB ATTUNE SZ6X5 (Femur) ×3 IMPLANT
JET LAVAGE IRRISEPT WOUND (IRRIGATION / IRRIGATOR) ×3
KIT TURNOVER KIT A (KITS) ×3 IMPLANT
LAVAGE JET IRRISEPT WOUND (IRRIGATION / IRRIGATOR) IMPLANT
MARKER SKIN DUAL TIP RULER LAB (MISCELLANEOUS) ×3 IMPLANT
NDL SAFETY ECLIPSE 18X1.5 (NEEDLE) ×1 IMPLANT
NDL SPNL 18GX3.5 QUINCKE PK (NEEDLE) ×1 IMPLANT
NEEDLE HYPO 18GX1.5 SHARP (NEEDLE) ×3
NEEDLE SPNL 18GX3.5 QUINCKE PK (NEEDLE) ×3 IMPLANT
NS IRRIG 1000ML POUR BTL (IV SOLUTION) ×3 IMPLANT
PACK TOTAL KNEE CUSTOM (KITS) ×3 IMPLANT
PADDING CAST COTTON 6X4 STRL (CAST SUPPLIES) ×3 IMPLANT
PADDING CAST SYN 6 (CAST SUPPLIES) ×2
PADDING CAST SYNTHETIC 6X4 NS (CAST SUPPLIES) IMPLANT
PENCIL SMOKE EVACUATOR (MISCELLANEOUS) IMPLANT
PIN DRILL FIX HALF THREAD (BIT) ×2 IMPLANT
PIN STEINMAN FIXATION KNEE (PIN) ×2 IMPLANT
PROTECTOR NERVE ULNAR (MISCELLANEOUS) ×3 IMPLANT
SAW OSC TIP CART 19.5X105X1.3 (SAW) ×3 IMPLANT
SEALER BIPOLAR AQUA 6.0 (INSTRUMENTS) ×3 IMPLANT
SET HNDPC FAN SPRY TIP SCT (DISPOSABLE) ×1 IMPLANT
SET PAD KNEE POSITIONER (MISCELLANEOUS) ×3 IMPLANT
SPONGE DRAIN TRACH 4X4 STRL 2S (GAUZE/BANDAGES/DRESSINGS) IMPLANT
SUT MNCRL AB 3-0 PS2 18 (SUTURE) ×3 IMPLANT
SUT MNCRL AB 4-0 PS2 18 (SUTURE) ×3 IMPLANT
SUT MON AB 2-0 CT1 36 (SUTURE) ×3 IMPLANT
SUT STRATAFIX PDO 1 14 VIOLET (SUTURE) ×3
SUT STRATFX PDO 1 14 VIOLET (SUTURE) ×1
SUT VIC AB 1 CTX 36 (SUTURE) ×6
SUT VIC AB 1 CTX36XBRD ANBCTR (SUTURE) ×2 IMPLANT
SUT VIC AB 2-0 CT1 27 (SUTURE) ×3
SUT VIC AB 2-0 CT1 TAPERPNT 27 (SUTURE) ×1 IMPLANT
SUTURE STRATFX PDO 1 14 VIOLET (SUTURE) ×1 IMPLANT
SYR 3ML LL SCALE MARK (SYRINGE) ×3 IMPLANT
TOWER CARTRIDGE SMART MIX (DISPOSABLE) ×2 IMPLANT
TRAY FOLEY MTR SLVR 16FR STAT (SET/KITS/TRAYS/PACK) IMPLANT
TUBE SUCTION HIGH CAP CLEAR NV (SUCTIONS) ×3 IMPLANT
WATER STERILE IRR 1000ML POUR (IV SOLUTION) ×6 IMPLANT
WRAP KNEE MAXI GEL POST OP (GAUZE/BANDAGES/DRESSINGS) ×2 IMPLANT

## 2020-09-11 NOTE — Op Note (Signed)
OPERATIVE REPORT  SURGEON: Rod Can, MD   ASSISTANT: Cherlynn June, PA-C.  PREOPERATIVE DIAGNOSIS: Right knee arthritis.   POSTOPERATIVE DIAGNOSIS: Right knee arthritis.   PROCEDURE: Right total knee arthroplasty.   IMPLANTS: DePuy Attune CR cementless femur, size 6. Attune fixed bearing affixium tibia, size 5. Aox polyethelyene insert, size 5 mm, CR. 3 button asymmetric patella, size 38 mm. GHV cement.  ANESTHESIA:  MAC, Regional, and Spinal  TOURNIQUET TIME: not utilized.  ESTIMATED BLOOD LOSS: -100 mL  ANTIBIOTICS: 2 g Ancef.  DRAINS: None.  COMPLICATIONS: None   CONDITION: PACU - hemodynamically stable.   BRIEF CLINICAL NOTE: Traci Garrison is a 80 y.o. female with a long-standing history of Right knee arthritis. After failing conservative management, the patient was indicated for total knee arthroplasty. The risks, benefits, and alternatives to the procedure were explained, and the patient elected to proceed.  PROCEDURE IN DETAIL: Regional block was obtained in the pre-op holding area. Once inside the operative room, spinal anesthesia was obtained, and a foley catheter was inserted. The patient was then positioned, and the lower extremity was prepped and draped in the normal sterile surgical fashion.  A tourniquet was not applied. A time-out was called verifying side and site of surgery. The patient received IV antibiotics within 60 minutes of beginning the procedure.    An anterior approach to the knee was performed utilizing a midvastus arthrotomy. A medial release was performed and the patellar fat pad was excised. Stryker imageless navigation was used to cut the distal femur perpendicular to the mechanical axis. A freehand patellar resection was performed, and the patella was sized an prepared with 3 lug holes.  Nagivation was used to make a neutral proximal tibia resection, taking 9 mm of bone from the less affected lateral side with 3 degrees of slope. The  menisci were excised. A spacer block was placed, and the alignment and balance in extension were confirmed.   The distal femur was sized using the 3-degree external rotation guide referencing the posterior femoral cortex. The appropriate 4-in-1 cutting block was pinned into place. Rotation was checked using Whiteside's line, the epicondylar axis, and then confirmed with a spacer block in flexion. The remaining femoral cuts were performed, taking care to protect the MCL.  The tibia was sized and the trial tray was pinned into place. The remaining trail components were inserted. The knee was stable to varus and valgus stress through a full range of motion. The patella tracked centrally, and the PCL was well balanced. The trial components were removed, and the proximal tibial surface was prepared. Small drill holes were made in the sclerotic subchondral bone.The cut bony surfaces were irrigated with pulse lavage. Femoral and tibial components were impacted into place. Final patellar components was cemented into place and excess cement was cleared. The trial insert was placed, and the knee was brought into extension while the cement polymerized. Once the cement was hard, the knee was tested for a final time and found to be well balanced. The trial insert was exchanged for the real polyethylene insert.   The wound was copiously irrigated with Irrisept followed by normal saline with pulse lavage.  Marcaine solution was injected into the periarticular soft tissue.  The wound was closed in layers using #1 Vicryl and Stratafix for the fascia, 2-0 Vicryl for the subcutaneous fat, 2-0 Monocryl for the deep dermal layer, 3-0 running Monocryl subcuticular Stitch, and Dermabond for the skin.  Once the glue was fully dried, an Aquacell Ag  and compressive dressing were applied.  The patient was aroused from anesthesia, and the patient was transported to the recovery room in stable ondition.  Sponge, needle, and instrument  counts were correct at the end of the case x2.  The patient tolerated the procedure well and there were no known complications.  Please note that a surgical assistant was a medical necessity for this procedure in order to perform it in a safe and expeditious manner. Surgical assistant was necessary to retract the ligaments and vital neurovascular structures to prevent injury to them and also necessary for proper positioning of the limb to allow for anatomic placement of the prosthesis.

## 2020-09-11 NOTE — Interval H&P Note (Signed)
History and Physical Interval Note:  09/11/2020 8:49 AM  Traci Garrison  has presented today for surgery, with the diagnosis of Right knee osteoarthritis.  The various methods of treatment have been discussed with the patient and family. After consideration of risks, benefits and other options for treatment, the patient has consented to  Procedure(s): COMPUTER ASSISTED TOTAL KNEE ARTHROPLASTY (Right) as a surgical intervention.  The patient's history has been reviewed, patient examined, no change in status, stable for surgery.  I have reviewed the patient's chart and labs.  Questions were answered to the patient's satisfaction.     Hilton Cork Vernor Monnig

## 2020-09-11 NOTE — Plan of Care (Signed)
Plan of care reviewed and discussed with the patient. 

## 2020-09-11 NOTE — Anesthesia Procedure Notes (Signed)
Procedure Name: MAC Date/Time: 09/11/2020 8:55 AM Performed by: Lissa Morales, CRNA Pre-anesthesia Checklist: Patient identified, Suction available, Emergency Drugs available and Patient being monitored Patient Re-evaluated:Patient Re-evaluated prior to induction Oxygen Delivery Method: Simple face mask Placement Confirmation: positive ETCO2

## 2020-09-11 NOTE — Evaluation (Signed)
Physical Therapy Evaluation Patient Details Name: Traci Garrison MRN: [id number] DOB: 1941/01/03 Today's Date: 09/11/2020   History of Present Illness  Patient is 80 y.o. female s/p Rt TKA on 09/11/20 with PMH significant for OA, anxiety, depression, HLD, MI in 2017, RA, GERD, osteoporosis, back surgery.   Clinical Impression  Gloris Shiroma is a 80 y.o. female POD 0 s/p Rt TKA. Patient reports independence with mobility at baseline. Patient is now limited by functional impairments (see PT problem list below) and requires min assist for bed mobility and Mod +2 for transfers with RW. Patient was limited by Rt knee buckling in standing and poor use of UE's on RW to reduce weight bearing. Pt returned to supine at EOS. Patient will benefit from continued skilled PT interventions to address impairments and progress towards PLOF. Acute PT will follow to progress mobility and stair training in preparation for safe discharge home.     Follow Up Recommendations Follow surgeon's recommendation for DC plan and follow-up therapies;Home health PT    Equipment Recommendations  Rolling walker with 5" wheels;3in1 (PT)    Recommendations for Other Services       Precautions / Restrictions Precautions Precautions: Fall Restrictions Weight Bearing Restrictions: No Other Position/Activity Restrictions: WBAT      Mobility  Bed Mobility Overal bed mobility: Needs Assistance Bed Mobility: Supine to Sit;Sit to Supine     Supine to sit: Min guard;HOB elevated;Min assist Sit to supine: Min guard;HOB elevated   General bed mobility comments: cues for use of bed rail, pt taking extra time to sit up. Pt able to raise bil LE's back onto bed and scoot superiorly in bed.    Transfers Overall transfer level: Needs assistance Equipment used: Rolling walker (2 wheeled) Transfers: Sit to/from Stand Sit to Stand: Mod assist;+2 physical assistance;From elevated surface;+2 safety/equipment         General  transfer comment: cues for safe hand placement on RW, repeated cues required for technique. Pt with poor use of UE's on RW and Rt LE buckling in standing requiring manual blocking from therapist to maintain extension.  Ambulation/Gait                Stairs            Wheelchair Mobility    Modified Rankin (Stroke Patients Only)       Balance Overall balance assessment: Needs assistance Sitting-balance support: Feet supported;Bilateral upper extremity supported Sitting balance-Leahy Scale: Fair     Standing balance support: During functional activity;Bilateral upper extremity supported Standing balance-Leahy Scale: Poor Standing balance comment: Rt knee buckling                             Pertinent Vitals/Pain Pain Assessment: 0-10 Pain Score: 5  Pain Location: Rt knee Pain Descriptors / Indicators: Aching;Discomfort Pain Intervention(s): Monitored during session;Limited activity within patient's tolerance;Repositioned;Ice applied    Home Living Family/patient expects to be discharged to:: Private residence Living Arrangements: Alone Available Help at Discharge: Family Type of Home: House Home Access: Level entry     Home Layout: One level Home Equipment: Shower seat      Prior Function Level of Independence: Independent               Hand Dominance   Dominant Hand: Right    Extremity/Trunk Assessment   Upper Extremity Assessment Upper Extremity Assessment: Overall WFL for tasks assessed    Lower Extremity Assessment Lower Extremity Assessment:  RLE deficits/detail RLE Deficits / Details: limited due to pain RLE Sensation: WNL RLE Coordination: WNL    Cervical / Trunk Assessment Cervical / Trunk Assessment: Normal  Communication   Communication: No difficulties  Cognition Arousal/Alertness: Awake/alert Behavior During Therapy: WFL for tasks assessed/performed Overall Cognitive Status: Within Functional Limits for tasks  assessed                                        General Comments      Exercises     Assessment/Plan    PT Assessment Patient needs continued PT services  PT Problem List Decreased strength;Decreased range of motion;Decreased activity tolerance;Decreased balance;Decreased mobility;Decreased knowledge of use of DME;Decreased knowledge of precautions       PT Treatment Interventions DME instruction;Gait training;Stair training;Functional mobility training;Therapeutic activities;Therapeutic exercise;Balance training;Patient/family education    PT Goals (Current goals can be found in the Care Plan section)  Acute Rehab PT Goals Patient Stated Goal: none stated PT Goal Formulation: With patient Time For Goal Achievement: 09/18/20 Potential to Achieve Goals: Good    Frequency 7X/week   Barriers to discharge        Co-evaluation               AM-PAC PT "6 Clicks" Mobility  Outcome Measure Help needed turning from your back to your side while in a flat bed without using bedrails?: A Little Help needed moving from lying on your back to sitting on the side of a flat bed without using bedrails?: A Little Help needed moving to and from a bed to a chair (including a wheelchair)?: A Little Help needed standing up from a chair using your arms (e.g., wheelchair or bedside chair)?: A Lot Help needed to walk in hospital room?: A Lot Help needed climbing 3-5 steps with a railing? : Total 6 Click Score: 14    End of Session Equipment Utilized During Treatment: Gait belt Activity Tolerance: Patient tolerated treatment well Patient left: in bed;with call bell/phone within reach;with bed alarm set Nurse Communication: Mobility status PT Visit Diagnosis: Muscle weakness (generalized) (M62.81);Difficulty in walking, not elsewhere classified (R26.2)    Time: 8325-4982 PT Time Calculation (min) (ACUTE ONLY): 19 min   Charges:   PT Evaluation $PT Eval Low Complexity:  1 Low          Verner Mould, DPT Acute Rehabilitation Services Office [phone] Pager [phone]   Jacques Navy 09/11/2020, 6:42 PM

## 2020-09-11 NOTE — Anesthesia Procedure Notes (Signed)
Anesthesia Regional Block: Adductor canal block   Pre-Anesthetic Checklist: , timeout performed,  Correct Patient, Correct Site, Correct Laterality,  Correct Procedure, Correct Position, site marked,  Risks and benefits discussed,  Surgical consent,  Pre-op evaluation,  At surgeon's request and post-op pain management  Laterality: Lower and Right  Prep: chloraprep       Needles:  Injection technique: Single-shot  Needle Type: Echogenic Stimulator Needle     Needle Length: 9cm  Needle Gauge: 20   Needle insertion depth: 2.5 cm   Additional Needles:   Procedures:,,,, ultrasound used (permanent image in chart),,    Narrative:  Start time: 09/11/2020 8:05 AM End time: 09/11/2020 8:13 AM Injection made incrementally with aspirations every 5 mL.  Performed by: Personally  Anesthesiologist: Lyn Hollingshead, MD

## 2020-09-11 NOTE — Anesthesia Postprocedure Evaluation (Signed)
Anesthesia Post Note  Patient: Traci Garrison  Procedure(s) Performed: COMPUTER ASSISTED TOTAL KNEE ARTHROPLASTY (Right: Knee)     Patient location during evaluation: PACU Anesthesia Type: Spinal Level of consciousness: awake and sedated Pain management: pain level controlled Vital Signs Assessment: post-procedure vital signs reviewed and stable Respiratory status: spontaneous breathing Cardiovascular status: stable Postop Assessment: no headache, no backache, spinal receding, patient able to bend at knees and no apparent nausea or vomiting Anesthetic complications: no   No notable events documented.  Last Vitals:  Vitals:   09/11/20 1200 09/11/20 1215  BP: (!) 92/53 (!) 86/56  Pulse: 78 77  Resp: 12 14  Temp:    SpO2: 100% 100%    Last Pain:  Vitals:   09/11/20 1200  TempSrc:   PainSc: 0-No pain                 John F Salome Arnt

## 2020-09-11 NOTE — Discharge Instructions (Signed)
 Dr. Jude Linck Total Joint Specialist [facility] [address] [place], [place] [address] [phone]  TOTAL KNEE REPLACEMENT POSTOPERATIVE DIRECTIONS    Knee Rehabilitation, Guidelines Following Surgery  Results after knee surgery are often greatly improved when you follow the exercise, range of motion and muscle strengthening exercises prescribed by your doctor. Safety measures are also important to protect the knee from further injury. Any time any of these exercises cause you to have increased pain or swelling in your knee joint, decrease the amount until you are comfortable again and slowly increase them. If you have problems or questions, call your caregiver or physical therapist for advice.   WEIGHT BEARING Weight bearing as tolerated with assist device (walker, cane, etc) as directed, use it as long as suggested by your surgeon or therapist, typically at least 4-6 weeks.  HOME CARE INSTRUCTIONS  Remove items at home which could result in a fall. This includes throw rugs or furniture in walking pathways.  Continue medications as instructed at time of discharge. You may have some home medications which will be placed on hold until you complete the course of blood thinner medication.  You may start showering once you are discharged home but do not submerge the incision under water. Just pat the incision dry and apply a dry gauze dressing on daily. Walk with walker as instructed.  You may resume a sexual relationship in one month or when given the OK by your doctor.  Use walker as long as suggested by your caregivers. Avoid periods of inactivity such as sitting longer than an hour when not asleep. This helps prevent blood clots.  You may put full weight on your legs and walk as much as is comfortable.  You may return to work once you are cleared by your doctor.  Do not drive a car for 6 weeks or until released by you surgeon.  Do not drive while  taking narcotics.  Wear the elastic stockings for three weeks following surgery during the day but you may remove then at night. Make sure you keep all of your appointments after your operation with all of your doctors and caregivers. You should call the office at the above phone number and make an appointment for approximately two weeks after the date of your surgery. Do not remove your surgical dressing. The dressing is waterproof; you may take showers in 3 days, but do not take tub baths or submerge the dressing. Please pick up a stool softener and laxative for home use as long as you are requiring pain medications. ICE to the affected knee every three hours for 30 minutes at a time and then as needed for pain and swelling.  Continue to use ice on the knee for pain and swelling from surgery. You may notice swelling that will progress down to the foot and ankle.  This is normal after surgery.  Elevate the leg when you are not up walking on it.   It is important for you to complete the blood thinner medication as prescribed by your doctor. Continue to use the breathing machine which will help keep your temperature down.  It is common for your temperature to cycle up and down following surgery, especially at night when you are not up moving around and exerting yourself.  The breathing machine keeps your lungs expanded and your temperature down.  RANGE OF MOTION AND STRENGTHENING EXERCISES  Rehabilitation of the knee is important following a knee injury or an   operation. After just a few days of immobilization, the muscles of the thigh which control the knee become weakened and shrink (atrophy). Knee exercises are designed to build up the tone and strength of the thigh muscles and to improve knee motion. Often times heat used for twenty to thirty minutes before working out will loosen up your tissues and help with improving the range of motion but do not use heat for the first two weeks following surgery.  These exercises can be done on a training (exercise) mat, on the floor, on a table or on a bed. Use what ever works the best and is most comfortable for you Knee exercises include:  Leg Lifts - While your knee is still immobilized in a splint or cast, you can do straight leg raises. Lift the leg to 60 degrees, hold for 3 sec, and slowly lower the leg. Repeat 10-20 times 2-3 times daily. Perform this exercise against resistance later as your knee gets better.  Quad and Hamstring Sets - Tighten up the muscle on the front of the thigh (Quad) and hold for 5-10 sec. Repeat this 10-20 times hourly. Hamstring sets are done by pushing the foot backward against an object and holding for 5-10 sec. Repeat as with quad sets.  A rehabilitation program following serious knee injuries can speed recovery and prevent re-injury in the future due to weakened muscles. Contact your doctor or a physical therapist for more information on knee rehabilitation.   POST-OPERATIVE OPIOID TAPER INSTRUCTIONS: It is important to wean off of your opioid medication as soon as possible. If you do not need pain medication after your surgery it is ok to stop day one. Opioids include: Codeine, Hydrocodone(Norco, Vicodin), Oxycodone(Percocet, oxycontin) and hydromorphone amongst others.  Long term and even short term use of opiods can cause: Increased pain response Dependence Constipation Depression Respiratory depression And more.  Withdrawal symptoms can include Flu like symptoms Nausea, vomiting And more Techniques to manage these symptoms Hydrate well Eat regular healthy meals Stay active Use relaxation techniques(deep breathing, meditating, yoga) Do Not substitute Alcohol to help with tapering If you have been on opioids for less than two weeks and do not have pain than it is ok to stop all together.  Plan to wean off of opioids This plan should start within one week post op of your joint replacement. Maintain the same  interval or time between taking each dose and first decrease the dose.  Cut the total daily intake of opioids by one tablet each day Next start to increase the time between doses. The last dose that should be eliminated is the evening dose.    SKILLED REHAB INSTRUCTIONS: If the patient is transferred to a skilled rehab facility following release from the hospital, a list of the current medications will be sent to the facility for the patient to continue.  When discharged from the skilled rehab facility, please have the facility set up the patient's Home Health Physical Therapy prior to being released. Also, the skilled facility will be responsible for providing the patient with their medications at time of release from the facility to include their pain medication, the muscle relaxants, and their blood thinner medication. If the patient is still at the rehab facility at time of the two week follow up appointment, the skilled rehab facility will also need to assist the patient in arranging follow up appointment in our office and any transportation needs.  MAKE SURE YOU:  Understand these instructions.  Will watch   your condition.  Will get help right away if you are not doing well or get worse.    Pick up stool softner and laxative for home use following surgery while on pain medications. Do NOT remove your dressing. You may shower.  Do not take tub baths or submerge incision under water. May shower starting three days after surgery. Please use a clean towel to pat the incision dry following showers. Continue to use ice for pain and swelling after surgery. Do not use any lotions or creams on the incision until instructed by your surgeon.  

## 2020-09-11 NOTE — Progress Notes (Signed)
Orthopedic Tech Progress Note Patient Details:  Traci Garrison [birth year] [id number]  Ortho Devices Type of Ortho Device: Knee Immobilizer   Post Interventions Instructions Provided: Care of device  [name] 09/11/2020, 7:51 PM

## 2020-09-11 NOTE — Anesthesia Procedure Notes (Signed)
Spinal  Patient location during procedure: OR End time: 09/11/2020 9:01 AM Reason for block: surgical anesthesia Staffing Performed: resident/CRNA  Resident/CRNA: Lissa Morales, CRNA Preanesthetic Checklist Completed: patient identified, IV checked, site marked, risks and benefits discussed, surgical consent, monitors and equipment checked, pre-op evaluation and timeout performed Spinal Block Patient position: sitting Prep: Betadine and DuraPrep Patient monitoring: heart rate, continuous pulse ox and blood pressure Approach: midline Location: L3-4 Injection technique: single-shot Needle Needle type: Pencan  Needle gauge: 24 G Needle length: 9 cm Assessment Events: CSF return Additional Notes Expiration date of kit checked and confirmed. Patient tolerated procedure well, without complications.

## 2020-09-11 NOTE — Transfer of Care (Signed)
Immediate Anesthesia Transfer of Care Note  Patient: Traci Garrison  Procedure(s) Performed: COMPUTER ASSISTED TOTAL KNEE ARTHROPLASTY (Right: Knee)  Patient Location: PACU  Anesthesia Type:Spinal  Level of Consciousness: awake, drowsy and patient cooperative  Airway & Oxygen Therapy: Patient Spontanous Breathing and Patient connected to face mask oxygen  Post-op Assessment: Report given to RN and Post -op Vital signs reviewed and stable  Post vital signs: stable  Last Vitals:  Vitals Value Taken Time  BP 94/48 09/11/20 1145  Temp    Pulse 80 09/11/20 1145  Resp 21 09/11/20 1145  SpO2 92 % 09/11/20 1145  Vitals shown include unvalidated device data.  Last Pain:  Vitals:   09/11/20 0702  TempSrc: Oral  PainSc:          Complications: No notable events documented.

## 2020-09-12 DIAGNOSIS — M1711 Unilateral primary osteoarthritis, right knee: Secondary | ICD-10-CM | POA: Diagnosis not present

## 2020-09-12 LAB — BASIC METABOLIC PANEL
Anion gap: 4 — ABNORMAL LOW (ref 5–15)
BUN: 13 mg/dL (ref 8–23)
CO2: 23 mmol/L (ref 22–32)
Calcium: 8.2 mg/dL — ABNORMAL LOW (ref 8.9–10.3)
Chloride: 111 mmol/L (ref 98–111)
Creatinine, Ser: 0.6 mg/dL (ref 0.44–1.00)
GFR, Estimated: >60 mL/min (ref >60)
Glucose, Bld: 119 mg/dL — ABNORMAL HIGH (ref 70–99)
Potassium: 4 mmol/L (ref 3.5–5.1)
Sodium: 138 mmol/L (ref 135–145)

## 2020-09-12 LAB — CBC
HCT: 31.2 % — ABNORMAL LOW (ref 36.0–46.0)
Hemoglobin: 9.8 g/dL — ABNORMAL LOW (ref 12.0–15.0)
MCH: 31.2 pg (ref 26.0–34.0)
MCHC: 31.4 g/dL (ref 30.0–36.0)
MCV: 99.4 fL (ref 80.0–100.0)
Platelets: 150 10*3/uL (ref 150–400)
RBC: 3.14 MIL/uL — ABNORMAL LOW (ref 3.87–5.11)
RDW: 14.5 % (ref 11.5–15.5)
WBC: 12.1 10*3/uL — ABNORMAL HIGH (ref 4.0–10.5)
nRBC: 0 % (ref 0.0–0.2)

## 2020-09-12 MED ORDER — ONDANSETRON HCL 4 MG PO TABS: 4.0000 mg | ORAL_TABLET | Freq: Four times a day (QID) | ORAL | 0 refills | Status: AC | PRN

## 2020-09-12 MED ORDER — DOCUSATE SODIUM 100 MG PO CAPS: 100.0000 mg | ORAL_CAPSULE | Freq: Two times a day (BID) | ORAL | 0 refills | Status: AC

## 2020-09-12 MED ORDER — ASPIRIN 81 MG PO CHEW: 81.0000 mg | CHEWABLE_TABLET | Freq: Two times a day (BID) | ORAL | 0 refills | Status: AC

## 2020-09-12 MED ORDER — SENNA 8.6 MG PO TABS: 1.0000 | ORAL_TABLET | Freq: Two times a day (BID) | ORAL | 0 refills | Status: AC

## 2020-09-12 MED ORDER — MELOXICAM 15 MG PO TABS: 15.0000 mg | ORAL_TABLET | Freq: Every day | ORAL | 0 refills | Status: AC

## 2020-09-12 MED ORDER — HYDROMORPHONE HCL 2 MG PO TABS: 2.0000 mg | ORAL_TABLET | Freq: Four times a day (QID) | ORAL | 0 refills | Status: AC | PRN

## 2020-09-12 NOTE — TOC Transition Note (Signed)
Transition of Care [facility]) - CM/SW Discharge Note   Patient Details  Name: Traci Garrison MRN: [id number] Date of Birth: December 24, 1940  Transition [facility] CM/SW Contact:  Lennart Pall, LCSW Phone Number: 09/12/2020, 12:59 PM   Clinical Narrative:     Met briefly with pt and confirming she needs rolling walker - ordered via [place].  Plan for OPPT already arranged at [facility] in [place].  No further TOC needs.  Final next level of care: OP Rehab Barriers to Discharge: Barriers Resolved   Patient Goals and CMS Choice Patient states their goals for this hospitalization and ongoing recovery are:: return home      Discharge Placement                       Discharge Plan and Services                DME Arranged: Walker rolling DME Agency: [employer] Date DME Agency Contacted: 09/12/20 Time DME Agency Contacted: [id number] Representative spoke with at DME Agency: Cristie Hem            Social Determinants of Health ([place]) Interventions     Readmission Risk Interventions No flowsheet data found.

## 2020-09-12 NOTE — Progress Notes (Signed)
Physical Therapy Treatment Patient Details Name: Traci Garrison MRN: [id number] DOB: 1940/12/02 Today's Date: 09/12/2020    History of Present Illness Patient is 80 y.o. female s/p Rt TKA on 09/11/20 with PMH significant for OA, anxiety, depression, HLD, MI in 2017, RA, GERD, osteoporosis, back surgery.    PT Comments    Pt continues very cooperative and progressing well with mobility with noted improvement in gait stability - pt continues to require cueing for maintaining position from RW and for basic safety awareness.  Follow Up Recommendations  Follow surgeon's recommendation for DC plan and follow-up therapies;Home health PT     Equipment Recommendations  Rolling walker with 5" wheels;3in1 (PT)    Recommendations for Other Services       Precautions / Restrictions Precautions Precautions: Fall Restrictions Weight Bearing Restrictions: No Other Position/Activity Restrictions: WBAT    Mobility  Bed Mobility               General bed mobility comments: Pt up in chair and requests back to same    Transfers Overall transfer level: Needs assistance Equipment used: Rolling walker (2 wheeled) Transfers: Sit to/from Stand Sit to Stand: Min guard;Supervision         General transfer comment: cues for LE management and use of UEs to self assist  Ambulation/Gait Ambulation/Gait assistance: Min guard;Supervision Gait Distance (Feet): 120 Feet Assistive device: Rolling walker (2 wheeled) Gait Pattern/deviations: Step-to pattern;Decreased step length - right;Decreased step length - left;Shuffle;Trunk flexed Gait velocity: decr   General Gait Details: cues for seqeunce, posture, position from RW and safety awareness   Stairs             Wheelchair Mobility    Modified Rankin (Stroke Patients Only)       Balance Overall balance assessment: Needs assistance Sitting-balance support: Feet supported;Bilateral upper extremity supported Sitting balance-Leahy  Scale: Good     Standing balance support: During functional activity;Bilateral upper extremity supported Standing balance-Leahy Scale: Fair                              Cognition Arousal/Alertness: Awake/alert Behavior During Therapy: WFL for tasks assessed/performed Overall Cognitive Status: Within Functional Limits for tasks assessed                                        Exercises Total Joint Exercises Ankle Circles/Pumps: AROM;Both;15 reps;Supine Quad Sets: AROM;Both;Supine;5 reps Heel Slides: AAROM;Right;Supine;5 reps Straight Leg Raises: AAROM;AROM;Right;Supine;5 reps Long Arc Quad: AAROM;Right;5 reps;Seated    General Comments        Pertinent Vitals/Pain Pain Assessment: 0-10 Pain Score: 7  Pain Location: Rt knee Pain Descriptors / Indicators: Aching;Discomfort Pain Intervention(s): Limited activity within patient's tolerance;Monitored during session;Premedicated before session;Ice applied    Home Living                      Prior Function            PT Goals (current goals can now be found in the care plan section) Acute Rehab PT Goals Patient Stated Goal: regain iND PT Goal Formulation: With patient Time For Goal Achievement: 09/18/20 Potential to Achieve Goals: Good Progress towards PT goals: Progressing toward goals    Frequency    7X/week      PT Plan Current plan remains appropriate    Co-evaluation  AM-PAC PT "6 Clicks" Mobility   Outcome Measure  Help needed turning from your back to your side while in a flat bed without using bedrails?: A Little Help needed moving from lying on your back to sitting on the side of a flat bed without using bedrails?: A Little Help needed moving to and from a bed to a chair (including a wheelchair)?: A Little Help needed standing up from a chair using your arms (e.g., wheelchair or bedside chair)?: A Little Help needed to walk in hospital room?: A  Little Help needed climbing 3-5 steps with a railing? : A Lot 6 Click Score: 17    End of Session Equipment Utilized During Treatment: Gait belt Activity Tolerance: Patient tolerated treatment well;Patient limited by pain Patient left: in chair;with call bell/phone within reach;with chair alarm set;with family/visitor present Nurse Communication: Mobility status PT Visit Diagnosis: Muscle weakness (generalized) (M62.81);Difficulty in walking, not elsewhere classified (R26.2)     Time: 1421-1450 PT Time Calculation (min) (ACUTE ONLY): 29 min  Charges:  $Gait Training: 8-22 mins $Therapeutic Exercise: 8-22 mins                     [place] Pager [phone] Office [phone]    Gelene Recktenwald 09/12/2020, 3:45 PM

## 2020-09-12 NOTE — Discharge Summary (Signed)
Physician Discharge Summary  Patient ID: Traci Garrison MRN: [id number] DOB/AGE: 78-07-1940 78 y.o.  Admit date: 09/11/2020 Discharge date: 09/12/2020  Admission Diagnoses:  Osteoarthritis of right knee  Discharge Diagnoses:  Principal Problem:   Osteoarthritis of right knee   Past Medical History:  Diagnosis Date   Anxiety    takes Xanax daily as needed   Arthritis    Methotrexate weekly   Cancer ([place])    melanoma(2018)   Depression    Gastric ulcer    history of    GERD (gastroesophageal reflux disease)    doesn't take any meds   Hepatitis 1963   History of blood transfusion 1977   no abnormal reaction noted   History of shingles    Hyperlipidemia    hx of-was on Lipitor but off since Jan 2016   Myocardial infarction ([place])    2017   Osteoporosis    Pneumonia    2 yrs ago   Rheumatoid arthritis ([place])    Actemera monthly   Spinal headache    no blood patch needed    Surgeries: Procedure(s): COMPUTER ASSISTED TOTAL KNEE ARTHROPLASTY on 09/11/2020   Consultants (if any):   Discharged Condition: Improved  Hospital Course: Traci Garrison is an 80 y.o. female who was admitted 09/11/2020 with a diagnosis of Osteoarthritis of right knee and went to the operating room on 09/11/2020 and underwent the above named procedures.    She was given perioperative antibiotics:  Anti-infectives (From admission, onward)    Start     Dose/Rate Route Frequency Ordered Stop   09/11/20 1500  hydroxychloroquine (PLAQUENIL) tablet 200 mg        200 mg Oral 2 times daily 09/11/20 1411     09/11/20 1500  ceFAZolin (ANCEF) IVPB 2g/100 mL premix        2 g 200 mL/hr over 30 Minutes Intravenous Every 6 hours 09/11/20 1411 09/11/20 2107   09/11/20 0615  ceFAZolin (ANCEF) IVPB 2g/100 mL premix        2 g 200 mL/hr over 30 Minutes Intravenous On call to O.R. 09/11/20 [id number] 09/11/20 0857     .  She was given sequential compression devices, early ambulation, and aspirin for DVT  prophylaxis.  She benefited maximally from the hospital stay and there were no complications.    Recent vital signs:  Vitals:   09/12/20 0522 09/12/20 0951  BP: (!) 117/54 (!) 110/45  Pulse: 75 85  Resp: 16 17  Temp: 99 F (37.2 C) 97.9 F (36.6 C)  SpO2: 100% 98%    Recent laboratory studies:  Lab Results  Component Value Date   HGB 9.8 (L) 09/12/2020   HGB 13.8 05/13/2020   HGB 12.6 08/28/2014   Lab Results  Component Value Date   WBC 12.1 (H) 09/12/2020   PLT 150 09/12/2020   Lab Results  Component Value Date   INR 1.0 09/09/2020   Lab Results  Component Value Date   NA 138 09/12/2020   K 4.0 09/12/2020   CL 111 09/12/2020   CO2 23 09/12/2020   BUN 13 09/12/2020   CREATININE 0.60 09/12/2020   GLUCOSE 119 (H) 09/12/2020     WEIGHT BEARING   Weight bearing as tolerated with assist device (walker, cane, etc) as directed, use it as long as suggested by your surgeon or therapist, typically at least 4-6 weeks.   EXERCISES  Results after joint replacement surgery are often greatly improved when you follow the exercise, range of motion and  muscle strengthening exercises prescribed by your doctor. Safety measures are also important to protect the joint from further injury. Any time any of these exercises cause you to have increased pain or swelling, decrease what you are doing until you are comfortable again and then slowly increase them. If you have problems or questions, call your caregiver or physical therapist for advice.   Rehabilitation is important following a joint replacement. After just a few days of immobilization, the muscles of the leg can become weakened and shrink (atrophy).  These exercises are designed to build up the tone and strength of the thigh and leg muscles and to improve motion. Often times heat used for twenty to thirty minutes before working out will loosen up your tissues and help with improving the range of motion but do not use heat for the  first two weeks following surgery (sometimes heat can increase post-operative swelling).   These exercises can be done on a training (exercise) mat, on the floor, on a table or on a bed. Use whatever works the best and is most comfortable for you.    Use music or television while you are exercising so that the exercises are a pleasant break in your day. This will make your life better with the exercises acting as a break in your routine that you can look forward to.   Perform all exercises about fifteen times, three times per day or as directed.  You should exercise both the operative leg and the other leg as well.  Exercises include:   Quad Sets - Tighten up the muscle on the front of the thigh (Quad) and hold for 5-10 seconds.   Straight Leg Raises - With your knee straight (if you were given a brace, keep it on), lift the leg to 60 degrees, hold for 3 seconds, and slowly lower the leg.  Perform this exercise against resistance later as your leg gets stronger.  Leg Slides: Lying on your back, slowly slide your foot toward your buttocks, bending your knee up off the floor (only go as far as is comfortable). Then slowly slide your foot back down until your leg is flat on the floor again.  Angel Wings: Lying on your back spread your legs to the side as far apart as you can without causing discomfort.  Hamstring Strength:  Lying on your back, push your heel against the floor with your leg straight by tightening up the muscles of your buttocks.  Repeat, but this time bend your knee to a comfortable angle, and push your heel against the floor.  You may put a pillow under the heel to make it more comfortable if necessary.   A rehabilitation program following joint replacement surgery can speed recovery and prevent re-injury in the future due to weakened muscles. Contact your doctor or a physical therapist for more information on knee rehabilitation.    CONSTIPATION  Constipation is defined medically as  fewer than three stools per week and severe constipation as less than one stool per week.  Even if you have a regular bowel pattern at home, your normal regimen is likely to be disrupted due to multiple reasons following surgery.  Combination of anesthesia, postoperative narcotics, change in appetite and fluid intake all can affect your bowels.   YOU MUST use at least one of the following options; they are listed in order of increasing strength to get the job done.  They are all available over the counter, and you may need to  use some, POSSIBLY even all of these options:    Drink plenty of fluids (prune juice may be helpful) and high fiber foods Colace 100 mg by mouth twice a day  Senokot for constipation as directed and as needed Dulcolax (bisacodyl), take with full glass of water  Miralax (polyethylene glycol) once or twice a day as needed.  If you have tried all these things and are unable to have a bowel movement in the first 3-4 days after surgery call either your surgeon or your primary doctor.    If you experience loose stools or diarrhea, hold the medications until you stool forms back up.  If your symptoms do not get better within 1 week or if they get worse, check with your doctor.  If you experience "the worst abdominal pain ever" or develop nausea or vomiting, please contact the office immediately for further recommendations for treatment.   ITCHING:  If you experience itching with your medications, try taking only a single pain pill, or even half a pain pill at a time.  You can also use Benadryl over the counter for itching or also to help with sleep.   TED HOSE STOCKINGS:  Use stockings on both legs until for at least 2 weeks or as directed by physician office. They may be removed at night for sleeping.  MEDICATIONS:  See your medication summary on the "After Visit Summary" that nursing will review with you.  You may have some home medications which will be placed on hold until you  complete the course of blood thinner medication.  It is important for you to complete the blood thinner medication as prescribed.  PRECAUTIONS:  If you experience chest pain or shortness of breath - call 911 immediately for transfer to the hospital emergency department.   If you develop a fever greater that 101 F, purulent drainage from wound, increased redness or drainage from wound, foul odor from the wound/dressing, or calf pain - CONTACT YOUR SURGEON.                                                   FOLLOW-UP APPOINTMENTS:  If you do not already have a post-op appointment, please call the office for an appointment to be seen by your surgeon.  Guidelines for how soon to be seen are listed in your "After Visit Summary", but are typically between 1-4 weeks after surgery.  OTHER INSTRUCTIONS:   Knee Replacement:  Do not place pillow under knee, focus on keeping the knee straight while resting. CPM instructions: 0-90 degrees, 2 hours in the morning, 2 hours in the afternoon, and 2 hours in the evening. Place foam block, curve side up under heel at all times except when in CPM or when walking.  DO NOT modify, tear, cut, or change the foam block in any way.  POST-OPERATIVE OPIOID TAPER INSTRUCTIONS: It is important to wean off of your opioid medication as soon as possible. If you do not need pain medication after your surgery it is ok to stop day one. Opioids include: Codeine, Hydrocodone(Norco, Vicodin), Oxycodone(Percocet, oxycontin) and hydromorphone amongst others.  Long term and even short term use of opiods can cause: Increased pain response Dependence Constipation Depression Respiratory depression And more.  Withdrawal symptoms can include Flu like symptoms Nausea, vomiting And more Techniques to manage these symptoms Hydrate well  Eat regular healthy meals Stay active Use relaxation techniques(deep breathing, meditating, yoga) Do Not substitute Alcohol to help with tapering If  you have been on opioids for less than two weeks and do not have pain than it is ok to stop all together.  Plan to wean off of opioids This plan should start within one week post op of your joint replacement. Maintain the same interval or time between taking each dose and first decrease the dose.  Cut the total daily intake of opioids by one tablet each day Next start to increase the time between doses. The last dose that should be eliminated is the evening dose.    Dental Antibiotics:  In most cases prophylactic antibiotics for Dental procdeures after total joint surgery are not necessary.  Exceptions are as follows:  1. History of prior total joint infection  2. Severely immunocompromised (Organ Transplant, cancer chemotherapy, Rheumatoid biologic meds such as [place])  3. Poorly controlled diabetes (A1C &gt; 8.0, blood glucose over 200)  If you have one of these conditions, contact your surgeon for an antibiotic prescription, prior to your dental procedure.   MAKE SURE YOU:  Understand these instructions.  Get help right away if you are not doing well or get worse.    Thank you for letting us be a part of your medical care team.  It is a privilege we respect greatly.  We hope these instructions will help you stay on track for a fast and full recovery!   Diagnostic Studies: DG Knee Right Port  Result Date: 09/11/2020 CLINICAL DATA:  Right knee arthroplasty EXAM: PORTABLE RIGHT KNEE - 1-2 VIEW COMPARISON:  10/08/2015 FINDINGS: Interval postsurgical changes from right total knee arthroplasty. Arthroplasty components are in their expected alignment. No periprosthetic fracture or other complication. Expected postoperative changes within the overlying soft tissues. IMPRESSION: Interval postsurgical changes from right total knee arthroplasty without evidence of immediate postoperative complication. Electronically Signed   By: Davina Poke D.O.   On: 09/11/2020 12:11    Disposition:  Discharge disposition: 01-Home or Self Care       Discharge Instructions     Call MD / Call 911   Complete by: As directed    If you experience chest pain or shortness of breath, CALL 911 and be transported to the hospital emergency room.  If you develope a fever above 101 F, pus (white drainage) or increased drainage or redness at the wound, or calf pain, call your surgeon's office.   Constipation Prevention   Complete by: As directed    Drink plenty of fluids.  Prune juice may be helpful.  You may use a stool softener, such as Colace (over the counter) 100 mg twice a day.  Use MiraLax (over the counter) for constipation as needed.   Diet - low sodium heart healthy   Complete by: As directed    Do not put a pillow under the knee. Place it under the heel.   Complete by: As directed    Driving restrictions   Complete by: As directed    No driving for 6 weeks   Increase activity slowly as tolerated   Complete by: As directed    Lifting restrictions   Complete by: As directed    No lifting for 6 weeks   Post-operative opioid taper instructions:   Complete by: As directed    POST-OPERATIVE OPIOID TAPER INSTRUCTIONS: It is important to wean off of your opioid medication as soon as possible. If you do  not need pain medication after your surgery it is ok to stop day one. Opioids include: Codeine, Hydrocodone(Norco, Vicodin), Oxycodone(Percocet, oxycontin) and hydromorphone amongst others.  Long term and even short term use of opiods can cause: Increased pain response Dependence Constipation Depression Respiratory depression And more.  Withdrawal symptoms can include Flu like symptoms Nausea, vomiting And more Techniques to manage these symptoms Hydrate well Eat regular healthy meals Stay active Use relaxation techniques(deep breathing, meditating, yoga) Do Not substitute Alcohol to help with tapering If you have been on opioids for less than two weeks and do not have pain  than it is ok to stop all together.  Plan to wean off of opioids This plan should start within one week post op of your joint replacement. Maintain the same interval or time between taking each dose and first decrease the dose.  Cut the total daily intake of opioids by one tablet each day Next start to increase the time between doses. The last dose that should be eliminated is the evening dose.      TED hose   Complete by: As directed    Use stockings (TED hose) for 2 weeks on both leg(s).  You may remove them at night for sleeping.        Follow-up Information     Swinteck, Aaron Edelman, MD. Schedule an appointment as soon as possible for a visit in 2 week(s).   Specialty: Orthopedic Surgery Why: For wound re-check Contact information: [address] [place] [address] [phone]                  Signed: Dorothyann Peng 09/12/2020, 10:28 AM

## 2020-09-12 NOTE — Progress Notes (Signed)
Physical Therapy Treatment Patient Details Name: Traci Garrison MRN: [id number] DOB: 12/19/1940 Today's Date: 09/12/2020    History of Present Illness Patient is 80 y.o. female s/p Rt TKA on 09/11/20 with PMH significant for OA, anxiety, depression, HLD, MI in 2017, RA, GERD, osteoporosis, back surgery.    PT Comments    Pt with marked improvement in WB tolerance on R LE and allowed good progression with mobility.   Follow Up Recommendations  Follow surgeon's recommendation for DC plan and follow-up therapies;Home health PT     Equipment Recommendations  Rolling walker with 5" wheels;3in1 (PT)    Recommendations for Other Services       Precautions / Restrictions Precautions Precautions: Fall Restrictions Weight Bearing Restrictions: No Other Position/Activity Restrictions: WBAT    Mobility  Bed Mobility Overal bed mobility: Needs Assistance Bed Mobility: Supine to Sit     Supine to sit: Min guard;Supervision     General bed mobility comments: increased time but no physical assist    Transfers Overall transfer level: Needs assistance Equipment used: Rolling walker (2 wheeled) Transfers: Sit to/from Stand Sit to Stand: Min assist;From elevated surface         General transfer comment: cues for LE management and use of UEs to self assist  Ambulation/Gait Ambulation/Gait assistance: Min assist Gait Distance (Feet): 60 Feet Assistive device: Rolling walker (2 wheeled) Gait Pattern/deviations: Step-to pattern;Decreased step length - right;Decreased step length - left;Shuffle;Trunk flexed Gait velocity: decr   General Gait Details: cues for seqeunce, posture, position from RW and safety awareness   Stairs             Wheelchair Mobility    Modified Rankin (Stroke Patients Only)       Balance Overall balance assessment: Needs assistance Sitting-balance support: Feet supported;Bilateral upper extremity supported Sitting balance-Leahy Scale: Good      Standing balance support: During functional activity;Bilateral upper extremity supported Standing balance-Leahy Scale: Poor                              Cognition Arousal/Alertness: Awake/alert Behavior During Therapy: WFL for tasks assessed/performed Overall Cognitive Status: Within Functional Limits for tasks assessed                                        Exercises Total Joint Exercises Ankle Circles/Pumps: AROM;Both;15 reps;Supine Quad Sets: AROM;Both;10 reps;Supine Heel Slides: AAROM;Right;15 reps;Supine Straight Leg Raises: AAROM;AROM;Right;10 reps;Supine Goniometric ROM: AAROM R knee -5 - 60    General Comments        Pertinent Vitals/Pain Pain Assessment: 0-10 Pain Score: 6  Pain Location: Rt knee Pain Descriptors / Indicators: Aching;Discomfort Pain Intervention(s): Limited activity within patient's tolerance;Monitored during session;Premedicated before session;Ice applied    Home Living                      Prior Function            PT Goals (current goals can now be found in the care plan section) Acute Rehab PT Goals Patient Stated Goal: regain iND PT Goal Formulation: With patient Time For Goal Achievement: 09/18/20 Potential to Achieve Goals: Good Progress towards PT goals: Progressing toward goals    Frequency    7X/week      PT Plan Current plan remains appropriate    Co-evaluation  AM-PAC PT "6 Clicks" Mobility   Outcome Measure  Help needed turning from your back to your side while in a flat bed without using bedrails?: A Little Help needed moving from lying on your back to sitting on the side of a flat bed without using bedrails?: A Little Help needed moving to and from a bed to a chair (including a wheelchair)?: A Little Help needed standing up from a chair using your arms (e.g., wheelchair or bedside chair)?: A Little Help needed to walk in hospital room?: A Little Help  needed climbing 3-5 steps with a railing? : A Lot 6 Click Score: 17    End of Session Equipment Utilized During Treatment: Gait belt Activity Tolerance: Patient tolerated treatment well Patient left: in chair;with call bell/phone within reach;with chair alarm set;with nursing/sitter in room Nurse Communication: Mobility status PT Visit Diagnosis: Muscle weakness (generalized) (M62.81);Difficulty in walking, not elsewhere classified (R26.2)     Time: 0321-2248 PT Time Calculation (min) (ACUTE ONLY): 30 min  Charges:  $Gait Training: 8-22 mins $Therapeutic Exercise: 8-22 mins                     [place] Pager [phone] Office [phone]    Kelcey Wickstrom 09/12/2020, 10:32 AM

## 2020-09-12 NOTE — Progress Notes (Signed)
    Subjective:  Patient reports pain as mild.  Denies N/V/CP/SOB. Patient resting comfortably in bed  Objective:   VITALS:   Vitals:   09/11/20 1421 09/11/20 2123 09/12/20 0126 09/12/20 0522  BP:  (!) 108/56 (!) 121/51 (!) 117/54  Pulse:  75 89 75  Resp:  17 16 16   Temp:  98.2 F (36.8 C) 98.5 F (36.9 C) 99 F (37.2 C)  TempSrc:  Oral Oral Oral  SpO2:  99% 99% 100%  Weight:      Height: 5\' 5"  (1.651 m)       NAD ABD soft Neurovascular intact Sensation intact distally Intact pulses distally Dorsiflexion/Plantar flexion intact Incision: dressing C/D/I   Lab Results  Component Value Date   WBC 12.1 (H) 09/12/2020   HGB 9.8 (L) 09/12/2020   HCT 31.2 (L) 09/12/2020   MCV 99.4 09/12/2020   PLT 150 09/12/2020   BMET    Component Value Date/Time   NA 138 09/12/2020 0317   K 4.0 09/12/2020 0317   CL 111 09/12/2020 0317   CO2 23 09/12/2020 0317   GLUCOSE 119 (H) 09/12/2020 0317   BUN 13 09/12/2020 0317   CREATININE 0.60 09/12/2020 0317   CALCIUM 8.2 (L) 09/12/2020 0317   GFRNONAA >60 09/12/2020 0317   GFRAA >60 08/28/2014 1321     Assessment/Plan: 1 Day Post-Op   Principal Problem:   Osteoarthritis of right knee   WBAT with walker DVT ppx: Aspirin, SCDs, TEDS PO pain control PT/OT Dispo: D/C home once cleared by therapy     Traci Garrison 09/12/2020, 7:56 AM   Rod Can, MD [phone] [place] [facility] now [facility]  [place] [address], [place], [place] [address] Phone: [phone] [email or web address] Facebook  [employer]

## 2020-09-12 NOTE — Progress Notes (Signed)
RN reviewed discharge instructions with patient and family. All questions answered.   Paperwork given. Prescriptions electronically sent to patient pharmacy.    NT rolled patient down with all belongings to family car.     Kamaron Deskins, RN  

## 2020-09-13 ENCOUNTER — Encounter (HOSPITAL_COMMUNITY): Payer: Self-pay | Admitting: Orthopedic Surgery

## 2023-04-08 ENCOUNTER — Other Ambulatory Visit: Payer: Self-pay | Admitting: Family Medicine

## 2023-04-08 DIAGNOSIS — M5412 Radiculopathy, cervical region: Secondary | ICD-10-CM

## 2023-04-22 NOTE — Discharge Instructions (Signed)

## 2023-04-23 ENCOUNTER — Ambulatory Visit
Admission: RE | Admit: 2023-04-23 | Discharge: 2023-04-23 | Disposition: A | Discharge status: Home / Self Care | Payer: PRIVATE HEALTH INSURANCE | Source: Ambulatory Visit | Attending: Family Medicine | Admitting: Family Medicine

## 2023-04-23 ENCOUNTER — Ambulatory Visit
Admission: RE | Admit: 2023-04-23 | Discharge: 2023-04-23 | Disposition: A | Discharge status: Home / Self Care | Payer: Medicare Other | Source: Ambulatory Visit | Attending: Family Medicine

## 2023-04-23 DIAGNOSIS — M5412 Radiculopathy, cervical region: Secondary | ICD-10-CM

## 2023-04-23 MED ORDER — IOPAMIDOL (ISOVUE-M 300) INJECTION 61%
10.0000 mL | Freq: Once | INTRAMUSCULAR | Status: AC
  Administered 2023-04-23: 20 mL via INTRATHECAL

## 2023-04-23 MED ORDER — ONDANSETRON HCL 4 MG/2ML IJ SOLN: 4.0000 mg | Freq: Once | INTRAMUSCULAR | Status: DC | PRN

## 2023-04-23 MED ORDER — DIAZEPAM 5 MG PO TABS: 5.0000 mg | ORAL_TABLET | Freq: Once | ORAL | Status: DC

## 2023-04-23 MED ORDER — MEPERIDINE HCL 50 MG/ML IJ SOLN: 50.0000 mg | Freq: Once | INTRAMUSCULAR | Status: DC | PRN
